# Patient Record
Sex: Female | Born: 1990 | Race: Black or African American | Hispanic: No | Marital: Single | Smoking: Never smoker
Health system: Southern US, Community
[De-identification: ages and names within clinical notes are randomized; demographics above are authoritative.]

## PROBLEM LIST (undated history)

## (undated) DIAGNOSIS — N390 Urinary tract infection, site not specified: Secondary | ICD-10-CM

## (undated) DIAGNOSIS — N926 Irregular menstruation, unspecified: Secondary | ICD-10-CM

---

## 2012-01-25 ENCOUNTER — Emergency Department (INDEPENDENT_AMBULATORY_CARE_PROVIDER_SITE_OTHER)
Admission: EM | Admit: 2012-01-25 | Discharge: 2012-01-25 | Disposition: A | Discharge status: Home / Self Care | Payer: Self-pay | Source: Home / Self Care | Attending: Emergency Medicine | Admitting: Emergency Medicine

## 2012-01-25 ENCOUNTER — Encounter (HOSPITAL_COMMUNITY): Payer: Self-pay | Admitting: *Deleted

## 2012-01-25 DIAGNOSIS — S93401A Sprain of unspecified ligament of right ankle, initial encounter: Secondary | ICD-10-CM

## 2012-01-25 DIAGNOSIS — S93409A Sprain of unspecified ligament of unspecified ankle, initial encounter: Secondary | ICD-10-CM

## 2012-01-25 HISTORY — DX: Irregular menstruation, unspecified: N92.6

## 2012-01-25 MED ORDER — HYDROCODONE-ACETAMINOPHEN 5-325 MG PO TABS: 2.0000 | ORAL_TABLET | ORAL | Status: AC | PRN

## 2012-01-25 MED ORDER — MELOXICAM 15 MG PO TABS: 15.0000 mg | ORAL_TABLET | Freq: Every day | ORAL | Status: DC

## 2012-01-25 NOTE — Discharge Instructions (Signed)
You will need to go to Occupational Health for a work note and work restrictions. Continue icing your ankle for 20 minutes at a time. Take the medication as written. Take 1 gram of tylenol with up to 4 times a day as needed for pain and fever. This with the mobic is an effective combination for pain. Take the hydrocodone/norco only for severe pain. Do not take the tylenol and hydrocodone/norco as they both have tylenol in them and too much can hurt your liver. Return if you get worse, have a  fever >100.4, or for any concerns.   Go to www.goodrx.com to look up your medications. This will give you a list of where you can find your prescriptions at the most affordable prices.
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
## 2012-01-29 ENCOUNTER — Telehealth (HOSPITAL_COMMUNITY): Payer: Self-pay | Admitting: *Deleted

## 2012-01-29 NOTE — ED Notes (Signed)
Pt called for instructions on ankle splint and crutches.  Pt states she hurt her ankle at work and was supposed to follow up with [facility] per Dr. Chaney Malling, but her employer won't allow her.  Pt states she can not perform her job with crutches.  Pt advised to come back in for reevaluation and possibly additional work note.

## 2012-02-05 ENCOUNTER — Telehealth: Payer: Self-pay | Admitting: Internal Medicine

## 2012-02-05 NOTE — Telephone Encounter (Signed)
Tried calling the patient back with number she provided.  A young lady answered and replyed that no one by that name was at the given number.  I explained I was looking for a patient to schedule who called earlier, and the called hung up the line.  This was the number she provided during the original call.   Arlys John approved to waive the 125.00 no insurance fee due to the appt being about worker's comp)

## 2012-02-05 NOTE — Telephone Encounter (Signed)
Ok with me 

## 2012-02-05 NOTE — Telephone Encounter (Signed)
The pt called and is hoping to be seen as soon as possible.  She would be a new pt.  She does not have insurance, but is under the impression her employer will pay for ov due to it being worker's comp related.  Can I add her into the physical slot for Monday?

## 2012-06-19 ENCOUNTER — Emergency Department (INDEPENDENT_AMBULATORY_CARE_PROVIDER_SITE_OTHER)
Admission: EM | Admit: 2012-06-19 | Discharge: 2012-06-19 | Disposition: A | Discharge status: Nursing Home (Includes ICF) | Payer: Self-pay | Source: Home / Self Care | Attending: Family Medicine | Admitting: Family Medicine

## 2012-06-19 ENCOUNTER — Encounter (HOSPITAL_COMMUNITY): Payer: Self-pay

## 2012-06-19 ENCOUNTER — Encounter (HOSPITAL_COMMUNITY): Payer: Self-pay | Admitting: *Deleted

## 2012-06-19 ENCOUNTER — Observation Stay (HOSPITAL_COMMUNITY)
Admission: EM | Admit: 2012-06-19 | Discharge: 2012-06-19 | Disposition: A | Discharge status: Home / Self Care | Payer: Self-pay | Attending: Emergency Medicine | Admitting: Emergency Medicine

## 2012-06-19 DIAGNOSIS — J45909 Unspecified asthma, uncomplicated: Secondary | ICD-10-CM | POA: Insufficient documentation

## 2012-06-19 DIAGNOSIS — N10 Acute tubulo-interstitial nephritis: Secondary | ICD-10-CM

## 2012-06-19 DIAGNOSIS — N390 Urinary tract infection, site not specified: Principal | ICD-10-CM | POA: Insufficient documentation

## 2012-06-19 DIAGNOSIS — N12 Tubulo-interstitial nephritis, not specified as acute or chronic: Secondary | ICD-10-CM

## 2012-06-19 DIAGNOSIS — R112 Nausea with vomiting, unspecified: Secondary | ICD-10-CM | POA: Insufficient documentation

## 2012-06-19 DIAGNOSIS — R509 Fever, unspecified: Secondary | ICD-10-CM | POA: Insufficient documentation

## 2012-06-19 HISTORY — DX: Urinary tract infection, site not specified: N39.0

## 2012-06-19 LAB — CBC WITH DIFFERENTIAL/PLATELET
Basophils Relative: 1 % (ref 0–1)
Eosinophils Absolute: 0 10*3/uL (ref 0.0–0.7)
Eosinophils Relative: 0 % (ref 0–5)
HCT: 33 % — ABNORMAL LOW (ref 36.0–46.0)
Hemoglobin: 11.5 g/dL — ABNORMAL LOW (ref 12.0–15.0)
Lymphs Abs: 1.8 10*3/uL (ref 0.7–4.0)
MCH: 25.4 pg — ABNORMAL LOW (ref 26.0–34.0)
MCHC: 34.8 g/dL (ref 30.0–36.0)
MCV: 72.8 fL — ABNORMAL LOW (ref 78.0–100.0)
Monocytes Absolute: 1.8 10*3/uL — ABNORMAL HIGH (ref 0.1–1.0)
Neutro Abs: 5.3 10*3/uL (ref 1.7–7.7)
Neutrophils Relative %: 59 % (ref 43–77)
RBC: 4.53 MIL/uL (ref 3.87–5.11)

## 2012-06-19 LAB — URINALYSIS, ROUTINE W REFLEX MICROSCOPIC
Glucose, UA: NEGATIVE mg/dL
Ketones, ur: NEGATIVE mg/dL
Protein, ur: 100 mg/dL — AB
pH: 6 (ref 5.0–8.0)

## 2012-06-19 LAB — POCT URINALYSIS DIP (DEVICE)
Protein, ur: >=300 mg/dL — AB
Specific Gravity, Urine: 1.01 (ref 1.005–1.030)
Urobilinogen, UA: 0.2 mg/dL (ref 0.0–1.0)
pH: 6 (ref 5.0–8.0)

## 2012-06-19 LAB — BASIC METABOLIC PANEL
BUN: 10 mg/dL (ref 6–23)
CO2: 27 mEq/L (ref 19–32)
Calcium: 9 mg/dL (ref 8.4–10.5)
Chloride: 98 mEq/L (ref 96–112)
Creatinine, Ser: 1.09 mg/dL (ref 0.50–1.10)
Glucose, Bld: 103 mg/dL — ABNORMAL HIGH (ref 70–99)

## 2012-06-19 LAB — POCT PREGNANCY, URINE: Preg Test, Ur: NEGATIVE

## 2012-06-19 LAB — URINE MICROSCOPIC-ADD ON

## 2012-06-19 MED ORDER — ONDANSETRON HCL 4 MG/2ML IJ SOLN: 4.0000 mg | Freq: Four times a day (QID) | INTRAMUSCULAR | Status: DC | PRN

## 2012-06-19 MED ORDER — ACETAMINOPHEN 325 MG PO TABS
975.0000 mg | ORAL_TABLET | Freq: Once | ORAL | Status: AC
  Administered 2012-06-19: 975 mg via ORAL

## 2012-06-19 MED ORDER — SODIUM CHLORIDE 0.9 % IV SOLN: 1000.0000 mL | INTRAVENOUS | Status: DC

## 2012-06-19 MED ORDER — CIPROFLOXACIN HCL 500 MG PO TABS: 500.0000 mg | ORAL_TABLET | Freq: Two times a day (BID) | ORAL | Status: AC

## 2012-06-19 MED ORDER — SODIUM CHLORIDE 0.9 % IV SOLN
Freq: Once | INTRAVENOUS | Status: AC
  Administered 2012-06-19: 14:00:00 via INTRAVENOUS

## 2012-06-19 MED ORDER — ONDANSETRON HCL 4 MG/2ML IJ SOLN
4.0000 mg | Freq: Once | INTRAMUSCULAR | Status: AC
  Administered 2012-06-19: 4 mg via INTRAVENOUS

## 2012-06-19 MED ORDER — ACETAMINOPHEN 325 MG PO TABS: 650.0000 mg | ORAL_TABLET | ORAL | Status: DC | PRN

## 2012-06-19 MED ORDER — ACETAMINOPHEN 325 MG PO TABS
ORAL_TABLET | ORAL | Status: AC
  Filled 2012-06-19: qty 3

## 2012-06-19 MED ORDER — ACETAMINOPHEN 325 MG PO TABS
ORAL_TABLET | ORAL | Status: AC
  Filled 2012-06-19: qty 1

## 2012-06-19 MED ORDER — SODIUM CHLORIDE 0.9 % IV BOLUS (SEPSIS): 1000.0000 mL | Freq: Once | INTRAVENOUS | Status: DC

## 2012-06-19 MED ORDER — DEXTROSE 5 % IV SOLN: 1.0000 g | INTRAVENOUS | Status: DC

## 2012-06-19 MED ORDER — HYDROMORPHONE HCL PF 1 MG/ML IJ SOLN: 1.0000 mg | Freq: Four times a day (QID) | INTRAMUSCULAR | Status: DC | PRN

## 2012-06-19 MED ORDER — OXYCODONE-ACETAMINOPHEN 5-325 MG PO TABS: 2.0000 | ORAL_TABLET | ORAL | Status: AC | PRN

## 2012-06-19 MED ORDER — ONDANSETRON HCL 4 MG/2ML IJ SOLN
INTRAMUSCULAR | Status: AC
  Filled 2012-06-19: qty 2

## 2012-06-19 MED ORDER — SODIUM CHLORIDE 0.9 % IV SOLN
INTRAVENOUS | Status: DC
  Administered 2012-06-19: 18:00:00 via INTRAVENOUS

## 2012-06-19 MED ORDER — DEXTROSE 5 % IV SOLN
1.0000 g | Freq: Two times a day (BID) | INTRAVENOUS | Status: DC
  Administered 2012-06-19: 1 g via INTRAVENOUS
  Filled 2012-06-19: qty 10

## 2012-06-19 NOTE — ED Provider Notes (Signed)
History     CSN: [id number]  Arrival date & time 06/19/12  1446   First MD Initiated Contact with Patient 06/19/12 1514      Chief Complaint  Patient presents with  . Urinary Tract Infection    (Consider location/radiation/quality/duration/timing/severity/associated sxs/prior treatment) Patient is a 21 y.o. female presenting with urinary tract infection. The history is provided by the patient.  Urinary Tract Infection   patient here with right-sided flank pain x4 days. Return home of 103. Vomiting without diarrhea. Does note dysuria and frequency. Denies any vaginal bleeding or discharge. Went to urgent care Center diagnosed with pyelonephritis there and sent here for evaluation the  Past Medical History  Diagnosis Date  . Asthma   . Irregular menses   . UTI (lower urinary tract infection)     History reviewed. No pertinent past surgical history.  Family History  Problem Relation Age of Onset  . Diabetes Other   . Hypertension Other     History  Substance Use Topics  . Smoking status: Never Smoker   . Smokeless tobacco: Not on file  . Alcohol Use: No    OB History    Grav Para Term Preterm Abortions TAB SAB Ect Mult Living                  Review of Systems  All other systems reviewed and are negative.    Allergies  Milk-related compounds  Home Medications  No current outpatient prescriptions on file.  BP 102/65  Pulse 68  Temp 99 F (37.2 C) (Oral)  Resp 18  SpO2 97%  Physical Exam  Nursing note and vitals reviewed. Constitutional: She is oriented to person, place, and time. She appears well-developed and well-nourished.  Non-toxic appearance. No distress.  HENT:  Head: Normocephalic and atraumatic.  Eyes: Conjunctivae normal, EOM and lids are normal. Pupils are equal, round, and reactive to light.  Neck: Normal range of motion. Neck supple. No tracheal deviation present. No mass present.  Cardiovascular: Normal rate, regular rhythm and normal  heart sounds.  Exam reveals no gallop.   No murmur heard. Pulmonary/Chest: Effort normal and breath sounds normal. No stridor. No respiratory distress. She has no decreased breath sounds. She has no wheezes. She has no rhonchi. She has no rales.  Abdominal: Soft. Normal appearance and bowel sounds are normal. She exhibits no distension. There is no tenderness. There is no rebound and no CVA tenderness.  Musculoskeletal: Normal range of motion. She exhibits no edema and no tenderness.  Neurological: She is alert and oriented to person, place, and time. She has normal strength. No cranial nerve deficit or sensory deficit. GCS eye subscore is 4. GCS verbal subscore is 5. GCS motor subscore is 6.  Skin: Skin is warm and dry. No abrasion and no rash noted.  Psychiatric: She has a normal mood and affect. Her speech is normal and behavior is normal.    ED Course  Procedures (including critical care time)   Labs Reviewed  CBC WITH DIFFERENTIAL  BASIC METABOLIC PANEL  URINALYSIS, ROUTINE W REFLEX MICROSCOPIC  URINE CULTURE   No results found.   No diagnosis found.    MDM  Patient sent to the CDU for treatment of her pyelonephritis        Toy Baker, MD 06/23/12 2332

## 2012-06-19 NOTE — ED Notes (Signed)
[facility] notified of transfer.  Attempt to call report to ED Charge RN - RN unavailable.

## 2012-06-19 NOTE — ED Notes (Signed)
Received bedside report from Clydie Braun, Charity fundraiser.  Patient currently resting quietly in bed; no respiratory or acute distress noted.  Patient updated on plan of care; informed patient that we are currently waiting on further orders/disposition from EDP.  Patient has no other questions or concerns at this time; will continue to monitor.

## 2012-06-19 NOTE — ED Notes (Signed)
Patient currently resting quietly; no respiratory or acute distress noted.  Patient currently waiting on discharge paperwork; getting dressed at this time.

## 2012-06-19 NOTE — ED Notes (Signed)
At 3 minute mark, urine HCG read negative; few minutes later faint positive line showed.  Blood drawn for serum HCG.  Patient states she had same thing happen back in January, and when she went for repeat labs, serum HCG levels had dropped back down.

## 2012-06-19 NOTE — ED Notes (Signed)
Patient given copy of discharge paperwork; went over discharge instructions with patient.  Patient instructed to take antibiotics and pain medication as directed, to finish entire antibiotic prescription, to not drive/drink while taking narcotics, to follow up with primary care physician, and to return to the ED for new, worsening, or concerning symptoms.

## 2012-06-19 NOTE — ED Notes (Addendum)
Reports started with UTI sxs Monday night (dysuria, urgency, frequent urination).  On Wed started with vomiting, diarrhea.  Has been having suprapubic abd pain and HA.  Has not been taking any meds.  Was unaware of fevers.  Unsure of LMP.

## 2012-06-19 NOTE — ED Notes (Signed)
Pt transferred here from [facility] for possible pyelonephritis. Pt c/o bladder pain for past 4 days. Also states she does remember having some pain to her right lower back earlier in the week. 20g to LAC w/ NS 500 ml to count. 104 temp from [facility] and was given 1gm Tylenol.

## 2012-06-19 NOTE — ED Provider Notes (Signed)
Patient transferred to the CDU for pyelonephritis protocol. After receiving IV Rocephin, patient reports significant improvement. She is no longer febrile. She is in no acute distress. Heart has RRR and lungs CTA bilaterally. Patient ambulatory without difficulty. She can be discharged with a 14 day course of Cipro and Percocet for pain.   Emilia Beck, PA-C 06/19/12 2214

## 2012-06-19 NOTE — ED Notes (Signed)
Report given to [place], ED Charge RN.

## 2012-06-19 NOTE — ED Provider Notes (Signed)
History     CSN: [id number]  Arrival date & time 06/19/12  1241   First MD Initiated Contact with Patient 06/19/12 1241      Chief Complaint  Patient presents with  . Urinary Tract Infection  . Fever  . Emesis    (Consider location/radiation/quality/duration/timing/severity/associated sxs/prior treatment) Patient is a 21 y.o. female presenting with female genitourinary complaint. The history is provided by the patient.  Female GU Problem Primary symptoms include pelvic pain and dysuria.  Primary symptoms include no vaginal bleeding. The maximum temperature recorded prior to her arrival was more than 104 F. The current episode started more than 2 days ago. The problem has been gradually worsening. The patient's menstrual history has been irregular. Associated symptoms include anorexia, diarrhea, nausea, vomiting, frequency and light-headedness.    Past Medical History  Diagnosis Date  . Asthma   . Irregular menses   . UTI (lower urinary tract infection)     History reviewed. No pertinent past surgical history.  Family History  Problem Relation Age of Onset  . Diabetes Other   . Hypertension Other     History  Substance Use Topics  . Smoking status: Never Smoker   . Smokeless tobacco: Not on file  . Alcohol Use: No    OB History    Grav Para Term Preterm Abortions TAB SAB Ect Mult Living                  Review of Systems  Constitutional: Positive for fever, chills, appetite change and fatigue.  HENT: Negative.   Gastrointestinal: Positive for nausea, vomiting, diarrhea and anorexia.  Genitourinary: Positive for dysuria, urgency, frequency and pelvic pain. Negative for vaginal bleeding, vaginal discharge and vaginal pain.  Neurological: Positive for light-headedness.    Allergies  Milk-related compounds  Home Medications   Current Outpatient Rx  Name Route Sig Dispense Refill  . ALBUTEROL IN Inhalation Inhale into the lungs as needed.    . MELOXICAM 15  MG PO TABS Oral Take 1 tablet (15 mg total) by mouth daily. 14 tablet 0    BP 98/61  Pulse 135  Temp 104 F (40 C) (Oral)  Resp 18  SpO2 98%  Physical Exam  Nursing note and vitals reviewed. Constitutional: She is oriented to person, place, and time. She appears well-developed and well-nourished.  HENT:  Mouth/Throat: Oropharynx is clear and moist.  Eyes: Conjunctivae normal are normal. Pupils are equal, round, and reactive to light.  Neck: Normal range of motion. Neck supple.  Cardiovascular: Normal rate and normal heart sounds.   Pulmonary/Chest: Breath sounds normal.  Abdominal: Soft. Bowel sounds are normal. She exhibits no distension and no mass. There is no tenderness. There is no rebound and no guarding.  Lymphadenopathy:    She has no cervical adenopathy.  Neurological: She is alert and oriented to person, place, and time.  Skin: Skin is warm and dry.    ED Course  Procedures (including critical care time)  Labs Reviewed  POCT URINALYSIS DIP (DEVICE) - Abnormal; Notable for the following:    Hgb urine dipstick LARGE (*)     Protein, ur >=300 (*)     Leukocytes, UA LARGE (*)  Biochemical Testing Only. Please order routine urinalysis from main lab if confirmatory testing is needed.   All other components within normal limits  POCT PREGNANCY, URINE   No results found.   1. Pyelonephritis, acute       MDM  U/a  abnl.  Linna Hoff, MD 06/19/12 1536

## 2012-06-20 NOTE — ED Provider Notes (Signed)
Medical screening examination/treatment/procedure(s) were conducted as a shared visit with non-physician practitioner(s) and myself.  I personally evaluated the patient during the encounter  Toy Baker, MD 06/20/12 0930

## 2012-06-21 LAB — URINE CULTURE: Colony Count: >=100000

## 2012-06-23 NOTE — ED Notes (Signed)
+   Urine Patient treated with Cipro-sensitive to same-chart appended per protocol MD. 

## 2012-10-27 ENCOUNTER — Emergency Department (HOSPITAL_COMMUNITY)
Admission: EM | Admit: 2012-10-27 | Discharge: 2012-10-27 | Disposition: A | Discharge status: Home / Self Care | Payer: No Typology Code available for payment source | Attending: Emergency Medicine | Admitting: Emergency Medicine

## 2012-10-27 ENCOUNTER — Emergency Department (HOSPITAL_COMMUNITY): Payer: No Typology Code available for payment source

## 2012-10-27 ENCOUNTER — Encounter (HOSPITAL_COMMUNITY): Payer: Self-pay | Admitting: Emergency Medicine

## 2012-10-27 DIAGNOSIS — S335XXA Sprain of ligaments of lumbar spine, initial encounter: Secondary | ICD-10-CM | POA: Insufficient documentation

## 2012-10-27 DIAGNOSIS — S239XXA Sprain of unspecified parts of thorax, initial encounter: Secondary | ICD-10-CM | POA: Insufficient documentation

## 2012-10-27 DIAGNOSIS — Z87440 Personal history of urinary (tract) infections: Secondary | ICD-10-CM | POA: Insufficient documentation

## 2012-10-27 DIAGNOSIS — J45909 Unspecified asthma, uncomplicated: Secondary | ICD-10-CM | POA: Insufficient documentation

## 2012-10-27 DIAGNOSIS — T148XXA Other injury of unspecified body region, initial encounter: Secondary | ICD-10-CM

## 2012-10-27 DIAGNOSIS — Z8742 Personal history of other diseases of the female genital tract: Secondary | ICD-10-CM | POA: Insufficient documentation

## 2012-10-27 DIAGNOSIS — S199XXA Unspecified injury of neck, initial encounter: Secondary | ICD-10-CM | POA: Insufficient documentation

## 2012-10-27 DIAGNOSIS — Y9389 Activity, other specified: Secondary | ICD-10-CM | POA: Insufficient documentation

## 2012-10-27 DIAGNOSIS — S0993XA Unspecified injury of face, initial encounter: Secondary | ICD-10-CM | POA: Insufficient documentation

## 2012-10-27 DIAGNOSIS — S139XXA Sprain of joints and ligaments of unspecified parts of neck, initial encounter: Secondary | ICD-10-CM | POA: Insufficient documentation

## 2012-10-27 DIAGNOSIS — Y92410 Unspecified street and highway as the place of occurrence of the external cause: Secondary | ICD-10-CM | POA: Insufficient documentation

## 2012-10-27 MED ORDER — HYDROCODONE-ACETAMINOPHEN 5-325 MG PO TABS: ORAL_TABLET | ORAL | Status: DC

## 2012-10-27 MED ORDER — CYCLOBENZAPRINE HCL 10 MG PO TABS
5.0000 mg | ORAL_TABLET | Freq: Once | ORAL | Status: AC
  Administered 2012-10-27: 5 mg via ORAL
  Filled 2012-10-27: qty 1

## 2012-10-27 MED ORDER — CYCLOBENZAPRINE HCL 10 MG PO TABS
10.0000 mg | ORAL_TABLET | Freq: Two times a day (BID) | ORAL | Status: DC | PRN
Start: 2012-10-27 — End: 2014-12-03

## 2012-10-27 MED ORDER — NAPROXEN 500 MG PO TABS: 500.0000 mg | ORAL_TABLET | Freq: Two times a day (BID) | ORAL | Status: DC

## 2012-10-27 MED ORDER — HYDROCODONE-ACETAMINOPHEN 5-325 MG PO TABS
1.0000 | ORAL_TABLET | Freq: Once | ORAL | Status: AC
  Administered 2012-10-27: 1 via ORAL
  Filled 2012-10-27: qty 1

## 2012-10-27 NOTE — ED Notes (Signed)
Care transferred and report given to Juli, RN 
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
## 2012-10-27 NOTE — ED Provider Notes (Signed)
Medical screening examination/treatment/procedure(s) were performed by non-physician practitioner and as supervising physician I was immediately available for consultation/collaboration.   Carleene Cooper III, MD 10/27/12 2009

## 2012-10-27 NOTE — ED Notes (Signed)
Pt ambulated with quick, steady gait.
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
## 2014-06-06 LAB — OB RESULTS CONSOLE ABO/RH

## 2014-06-06 LAB — OB RESULTS CONSOLE HEPATITIS B SURFACE ANTIGEN: Hepatitis B Surface Ag: NEGATIVE

## 2014-09-18 ENCOUNTER — Ambulatory Visit
Payer: No Typology Code available for payment source | Attending: Obstetrics and Gynecology | Admitting: Physical Therapy

## 2014-09-18 DIAGNOSIS — M5432 Sciatica, left side: Secondary | ICD-10-CM | POA: Insufficient documentation

## 2014-09-18 DIAGNOSIS — M5431 Sciatica, right side: Secondary | ICD-10-CM | POA: Diagnosis not present

## 2014-09-18 NOTE — Patient Instructions (Signed)
Therapeutic - Knee to Chest   Bend knee up towards chest, use towel if needed.  Push leg out against hands keeping leg in place.  Hold 5 seconds.  Perform 10 reps.  You can also do this sitting. Copyright  VHI. All rights reserved.   Backward Bend (Standing)   Arch backward to make hollow of back deeper. Hold _2-3___ seconds. Repeat _10___ times per set. Do __1__ sets per session. Do __2-3__ sessions per day.  Kristine CraneStephanie F Lyrika Williams, PT, DPT 09/18/2014 11:38 AM  [facility] [address] [place], [place] [address]  [phone] (office) [phone] (fax)
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
## 2014-10-02 ENCOUNTER — Ambulatory Visit: Payer: No Typology Code available for payment source | Admitting: Physical Therapy

## 2014-10-02 DIAGNOSIS — M5432 Sciatica, left side: Secondary | ICD-10-CM

## 2014-10-02 DIAGNOSIS — M5431 Sciatica, right side: Secondary | ICD-10-CM

## 2014-10-02 NOTE — Patient Instructions (Addendum)
Back Pain in Pregnancy Back pain during pregnancy is common. It happens in about half of all pregnancies. It is important for you and your baby that you remain active during your pregnancy.If you feel that back pain is not allowing you to remain active or sleep well, it is time to see your caregiver. Back pain may be caused by several factors related to changes during your pregnancy.Fortunately, unless you had trouble with your back before your pregnancy, the pain is likely to get better after you deliver. Low back pain usually occurs between the fifth and seventh months of pregnancy. It can, however, happen in the first couple months. Factors that increase the risk of back problems include:   Previous back problems.  Injury to your back.  Having twins or multiple births.  A chronic cough.  Stress.  Job-related repetitive motions.  Muscle or spinal disease in the back.  Family history of back problems, ruptured (herniated) discs, or osteoporosis.  Depression, anxiety, and panic attacks. CAUSES   When you are pregnant, your body produces a hormone called relaxin. This hormonemakes the ligaments connecting the low back and pubic bones more flexible. This flexibility allows the baby to be delivered more easily. When your ligaments are loose, your muscles need to work harder to support your back. Soreness in your back can come from tired muscles. Soreness can also come from back tissues that are irritated since they are receiving less support.  As the baby grows, it puts pressure on the nerves and blood vessels in your pelvis. This can cause back pain.  As the baby grows and gets heavier during pregnancy, the uterus pushes the stomach muscles forward and changes your center of gravity. This makes your back muscles work harder to maintain good posture. SYMPTOMS  Lumbar pain during pregnancy Lumbar pain during pregnancy usually occurs at or above the waist in the center of the back. There  may be pain and numbness that radiates into your leg or foot. This is similar to low back pain experienced by non-pregnant women. It usually increases with sitting for long periods of time, standing, or repetitive lifting. Tenderness may also be present in the muscles along your upper back. Posterior pelvic pain during pregnancy Pain in the back of the pelvis is more common than lumbar pain in pregnancy. It is a deep pain felt in your side at the waistline, or across the tailbone (sacrum), or in both places. You may have pain on one or both sides. This pain can also go into the buttocks and backs of the upper thighs. Pubic and groin pain may also be present. The pain does not quickly resolve with rest, and morning stiffness may also be present. Pelvic pain during pregnancy can be brought on by most activities. A high level of fitness before and during pregnancy may or may not prevent this problem. Labor pain is usually 1 to 2 minutes apart, lasts for about 1 minute, and involves a bearing down feeling or pressure in your pelvis. However, if you are at term with the pregnancy, constant low back pain can be the beginning of early labor, and you should be aware of this. DIAGNOSIS  X-rays of the back should not be done during the first 12 to 14 weeks of the pregnancy and only when absolutely necessary during the rest of the pregnancy. MRIs do not give off radiation and are safe during pregnancy. MRIs also should only be done when absolutely necessary. HOME CARE INSTRUCTIONS  Exercise   as directed by your caregiver. Exercise is the most effective way to prevent or manage back pain. If you have a back problem, it is especially important to avoid sports that require sudden body movements. Swimming and walking are great activities.  Do not stand in one place for long periods of time.  Do not wear high heels.  Sit in chairs with good posture. Use a pillow on your lower back if necessary. Make sure your head  rests over your shoulders and is not hanging forward.  Try sleeping on your side, preferably the left side, with a pillow or two between your legs. If you are sore after a night's rest, your bedmay betoo soft.Try placing a board between your mattress and box spring.  Listen to your body when lifting.If you are experiencing pain, ask for help or try bending yourknees more so you can use your leg muscles rather than your back muscles. Squat down when picking up something from the floor. Do not bend over.  Eat a healthy diet. Try to gain weight within your caregiver's recommendations.  Use heat or cold packs 3 to 4 times a day for 15 minutes to help with the pain.  Only take over-the-counter or prescription medicines for pain, discomfort, or fever as directed by your caregiver. Sudden (acute) back pain  Use bed rest for only the most extreme, acute episodes of back pain. Prolonged bed rest over 48 hours will aggravate your condition.  Ice is very effective for acute conditions.  Put ice in a plastic bag.  Place a towel between your skin and the bag.  Leave the ice on for 10 to 20 minutes every 2 hours, or as needed.  Using heat packs for 30 minutes prior to activities is also helpful. Continued back pain See your caregiver if you have continued problems. Your caregiver can help or refer you for appropriate physical therapy. With conditioning, most back problems can be avoided. Sometimes, a more serious issue may be the cause of back pain. You should be seen right away if new problems seem to be developing. Your caregiver may recommend:  A maternity girdle.  An elastic sling.  A back brace.  A massage therapist or acupuncture. SEEK MEDICAL CARE IF:   You are not able to do most of your daily activities, even when taking the pain medicine you were given.  You need a referral to a physical therapist or chiropractor.  You want to try acupuncture. SEEK IMMEDIATE MEDICAL CARE  IF:  You develop numbness, tingling, weakness, or problems with the use of your arms or legs.  You develop severe back pain that is no longer relieved with medicines.  You have a sudden change in bowel or bladder control.  You have increasing pain in other areas of the body.  You develop shortness of breath, dizziness, or fainting.  You develop nausea, vomiting, or sweating.  You have back pain which is similar to labor pains.  You have back pain along with your water breaking or vaginal bleeding.  You have back pain or numbness that travels down your leg.  Your back pain developed after you fell.  You develop pain on one side of your back. You may have a kidney stone.  You see blood in your urine. You may have a bladder infection or kidney stone.  You have back pain with blisters. You may have shingles. Back pain is fairly common during pregnancy but should not be accepted as just part of   the process. Back pain should always be treated as soon as possible. This will make your pregnancy as pleasant as possible. Document Released: 12/30/2005 Document Revised: 12/14/2011 Document Reviewed: 02/10/2011 [facility] Patient Information 2015 [employer] [place]. This information is not intended to replace advice given to you by your health care provider. Make sure you discuss any questions you have with your health care provider.  Pt given handout for Sacral Iliac Joint Exercises ' With Hip ADD with ball x 10,  Hip abduction with theraband with Green theraband Unilateral hip extension in supine x 10 Trunk rotation to left and right with  30 second hold  Pt given written information on Serola SI gait belt  Sleeping on Back  Place pillow under knees. A pillow with cervical support and a roll around waist are also helpful. Copyright  VHI. All rights reserved.  Sleeping on Side Place pillow between knees. Use cervical support under neck and a roll around waist as needed. Copyright  VHI.  All rights reserved.   Sleeping on Stomach   If this is the only desirable sleeping position, place pillow under lower legs, and under stomach or chest as needed.  Posture - Sitting   Sit upright, head facing forward. Try using a roll to support lower back. Keep shoulders relaxed, and avoid rounded back. Keep hips level with knees. Avoid crossing legs for long periods. Stand to Sit / Sit to Stand   To sit: Bend knees to lower self onto front edge of chair, then scoot back on seat. To stand: Reverse sequence by placing one foot forward, and scoot to front of seat. Use rocking motion to stand up.   Work Height and Reach  Ideal work height is no more than 2 to 4 inches below elbow level when standing, and at elbow level when sitting. Reaching should be limited to arm's length, with elbows slightly bent.  Bending  Bend at hips and knees, not back. Keep feet shoulder-width apart.    Posture - Standing   Good posture is important. Avoid slouching and forward head thrust. Maintain curve in low back and align ears over shoul- ders, hips over ankles.  Alternating Positions   Alternate tasks and change positions frequently to reduce fatigue and muscle tension. Take rest breaks. Computer Work   Position work to Art gallery managerface forward. Use proper work and seat height. Keep shoulders back and down, wrists straight, and elbows at right angles. Use chair that provides full back support. Add footrest and lumbar roll as needed.  Getting Into / Out of Car  Lower self onto seat, scoot back, then bring in one leg at a time. Reverse sequence to get out.  Dressing  Lie on back to pull socks or slacks over feet, or sit and bend leg while keeping back straight.    Housework - Sink  Place one foot on ledge of cabinet under sink when standing at sink for prolonged periods.   Pushing / Pulling  Pushing is preferable to pulling. Keep back in proper alignment, and use leg muscles to do the  work.  Deep Squat   Squat and lift with both arms held against upper trunk. Tighten stomach muscles without holding breath. Use smooth movements to avoid jerking.  Avoid Twisting   Avoid twisting or bending back. Pivot around using foot movements, and bend at knees if needed when reaching for articles.  Carrying Luggage   Distribute weight evenly on both sides. Use a cart whenever possible. Do not twist trunk.  Move body as a unit.   Lifting Principles .Maintain proper posture and head alignment. .Slide object as close as possible before lifting. .Move obstacles out of the way. .Test before lifting; ask for help if too heavy. .Tighten stomach muscles without holding breath. .Use smooth movements; do not jerk. .Use legs to do the work, and pivot with feet. .Distribute the work load symmetrically and close to the center of trunk. .Push instead of pull whenever possible.   Ask For Help   Ask for help and delegate to others when possible. Coordinate your movements when lifting together, and maintain the low back curve.  Log Roll   Lying on back, bend left knee and place left arm across chest. Roll all in one movement to the right. Reverse to roll to the left. Always move as one unit. Housework - Sweeping  Use long-handled equipment to avoid stooping.   Housework - Wiping  Position yourself as close as possible to reach work surface. Avoid straining your back.  Laundry - Unloading Wash   To unload small items at bottom of washer, lift leg opposite to arm being used to reach.  Gardening - Raking  Move close to area to be raked. Use arm movements to do the work. Keep back straight and avoid twisting.     Cart  When reaching into cart with one arm, lift opposite leg to keep back straight.   Getting Into / Out of Bed  Lower self to lie down on one side by raising legs and lowering head at the same time. Use arms to assist moving without twisting. Bend both knees  to roll onto back if desired. To sit up, start from lying on side, and use same move-ments in reverse. Housework - Vacuuming  Hold the vacuum with arm held at side. Step back and forth to move it, keeping head up. Avoid twisting.   Laundry - Armed forces training and education officerLoading Wash  Position laundry basket so that bending and twisting can be avoided.   Laundry - Unloading Dryer  Squat down to reach into clothes dryer or use a reacher.  Gardening - Weeding / Psychiatric nurselanting  Squat or Kneel. Knee pads may be helpful.

## 2014-10-02 NOTE — Therapy (Signed)
[facility] [address] [place], [place], [address] Phone: [phone]   Fax:  [phone]  Physical Therapy Treatment  Patient Details  Name: Kristine Williams MRN: [id number] Date of Birth: 1991/03/20  Encounter Date: 10/02/2014      PT End of Session - 10/02/14 1833    Visit Number 2   Number of Visits 16   Date for PT Re-Evaluation 11/17/14   PT Start Time 1105   PT Stop Time 1145   PT Time Calculation (min) 40 min   Equipment Utilized During Treatment Other (comment)  used SI belt for trial      Past Medical History  Diagnosis Date  . Asthma   . Irregular menses   . UTI (lower urinary tract infection)     No past surgical history on file.  There were no vitals taken for this visit.  Visit Diagnosis:  Sciatica, left  Sciatica, right  Worse for this visit      Subjective Assessment - 10/02/14 1108    Symptoms Pt reports pain 1-2 on R side aching nagging pain.  Sometimes it is 8/10 sharp pain radiating to knees on  both sides.     Pertinent History Pt is 7 months pregnant   How long can you sit comfortably? unlimited   How long can you stand comfortably? 10 minutes   How long can you walk comfortably? presently able to walk to shop for groceries but cannot lift   Diagnostic tests MRI bulging disc L4/5   Currently in Pain? Yes   Pain Score 2    Pain Location Back   Pain Orientation Right   Pain Descriptors / Indicators Aching;Stabbing  over R PSIS   Pain Type Neuropathic pain   Pain Radiating Towards none today   Pain Onset More than a month ago   Pain Frequency Intermittent   Aggravating Factors  unable to lie down without sensitive pain                    [facility] Adult PT Treatment/Exercise - 10/02/14 1141    Ambulation/Gait   Ambulation/Gait Yes   Ambulation/Gait Assistance 7: Independent   Gait Pattern Decreased stance time - right;Lateral trunk lean to left;Decreased trunk rotation   Posture/Postural Control   Posture/Postural Control Postural limitations   Postural Limitations Increased lumbar lordosis;Right pelvic obliquity   Posture Comments Pt will benefit from SI belt, trial of one decreased pain in supine and sit to stand and walking   Lumbar Exercises: Seated   Other Seated Lumbar Exercises self SI mob isometric with R knee flex and rotation hold 5 sec   Lumbar Exercises: Supine   Bridge 10 reps;3 seconds  x2   Other Supine Lumbar Exercises hip adduction with ball 2 sets of 10   Knee/Hip Exercises: Supine   Hip Adduction Isometric Strengthening  with PT adding resistance 2 sets of 10   Manual Therapy   Manual Therapy Other (comment);Myofascial release  muscle energy technique,    Myofascial Release of Piriformis/ R t   Other Manual Therapy kinesio tape for SI support and compression over Rt SI                PT Education - 10/02/14 1253    Education provided Yes   Education Details Pt given information about posture and body mechanics, back care in pregnancy, informatiion for [employer]          PT Short Term Goals - 10/02/14  1145    PT SHORT TERM GOAL #1   Title independent with HEP (10/16/14)   Time 4   Period Weeks   Status On-going   PT SHORT TERM GOAL #2   Title report pain < 5/10 with activity (10/16/14)   Time 4   Period Weeks   Status On-going   PT SHORT TERM GOAL #3   Title improve lumbar flexion and ext by 5 degrees for improved flexibility (10/16/14)   Time 4   Period Weeks   Status On-going           PT Long Term Goals - 10/02/14 1145    PT LONG TERM GOAL #1   Title independent with advanced HEP (11/13/14)   Time 8   Period Weeks   Status On-going   PT LONG TERM GOAL #2   Title verbalize understanding of posture/body mechanics to reduce the risk of reinjury (11/13/14)   Time 8   Period Weeks   Status On-going   PT LONG TERM GOAL #3   Title report pain < 3/10 with activity (11/13/14)   Time 8   Period Weeks   Status  On-going   PT LONG TERM GOAL #4   Title lumbar flexion and extension ROM WNL for improved mobility (11/13/13)   Time 8   Period Weeks   Status On-going               Plan - 10/02/14 1150    Clinical Impression Statement Pt is 2/10 with KT tape  different quality of pain aching than sharp.  Pt also benefitted from SI belt wear and was instructed how to purchase.  Pt instructed in SI exercises/ core strengthening exercises. PT with long term pain.  Pt will need to continue strengthening post deleivery of baby. Pt now at 7 months gestation.  Pt given HEP and posture for pregnancy as well as posture in sitting stanidng and ADL's                                                     Pt will benefit from skilled therapeutic intervention in order to improve on the following deficits Abnormal gait;Decreased mobility;Decreased strength;Pain;Postural dysfunction;Improper body mechanics;Decreased range of motion;Difficulty walking;Impaired flexibility   Rehab Potential Good   PT Frequency 2x / week   PT Duration 8 weeks   PT Treatment/Interventions ADLs/Self Care Home Management;Moist Heat;Therapeutic activities;Patient/family education;Passive range of motion;Therapeutic exercise;Traction;Gait training;Manual techniques;Neuromuscular re-education;Cryotherapy;Functional mobility training   PT Next Visit Plan review HEP, add stretches and core stability, assess KT tape benefit. Give written copy of [employer] belt to purchase.         Problem List There are no active problems to display for this patient.  Garen LahLawrie Beardsley, PT 10/02/2014 6:42 PM Phone: [phone] Fax: [phone]   [facility] [address] [place], [place], [address] Phone: [phone]   Fax:  [phone]

## 2014-10-04 ENCOUNTER — Ambulatory Visit: Payer: No Typology Code available for payment source | Admitting: Rehabilitation

## 2014-10-04 DIAGNOSIS — M5431 Sciatica, right side: Secondary | ICD-10-CM

## 2014-10-04 DIAGNOSIS — M5432 Sciatica, left side: Secondary | ICD-10-CM

## 2014-10-04 NOTE — Patient Instructions (Signed)
Angry Cat, All Fours   Kneel on hands and knees. Tuck chin and tighten stomach. Exhale and round back upward. Inhale and arch back downward. Hold each position 10___ seconds. Repeat _10__ times per session. Do 2___ sessions per day.  Copyright  VHI. All rights reserved.         Flexion   Sitting on knees, fold body over legs and relax head and arms on floor. Extend arms as far forward as possible. Hold _30___ seconds. Repeat __3__ times. Do ____ sessions per day.  Copyright  VHI. All rights reserved.  Trunk: Rotation   Lie on back on firm, flat surface with knees bent. Keep head and shoulders flat, slowly lower knees to floor. May also do with legs straight. Lift one at a time up and across to touch floor. Hold ___2_ minutes. Repeat __3__ times, each side. Do __2_ sessions per day. CAUTION: Movement should be gentle, steady and slow.  Copyright  VHI. All rights reserved.  LAY ON BACK WITH KNEES BENT, LIFT AND LOWER HIPS, STRAIGHTEN LEGS- CHECK HIP ALIGNMENT IF ONE SIDE IS HIGHER, PRESS 10 times for 5 Seconds on THAT KNEE LIFT HIPS, LOWER, STRAIGHTEN LEGS AND RECHECK REPEAT UNTIL LEVEL.   PERFORM EXERCISES AFTER HIP LEVEL

## 2014-10-04 NOTE — Therapy (Signed)
[place], [place], [address] Phone: [phone]   Fax:  [phone]  Physical Therapy Treatment  Patient Details  Name: Kristine Williams MRN: [id number] Date of Birth: 04-Feb-1991  Encounter Date: 10/04/2014      PT End of Session - 10/04/14 1217    Visit Number 3   Number of Visits 16   Date for PT Re-Evaluation 11/17/14   PT Start Time 1200   PT Stop Time 6606   PT Time Calculation (min) 45 min      Past Medical History  Diagnosis Date  . Asthma   . Irregular menses   . UTI (lower urinary tract infection)     No past surgical history on file.  There were no vitals taken for this visit.  Visit Diagnosis:  Sciatica, right  Sciatica, left      Subjective Assessment - 10/04/14 1201    Symptoms 7/10 pain rt side up to a 9/10 since last visit, could not sleep that night. Pt reports aching not radiating down leg that was sharp shooting before. Pt reports no benefit with tape and it was painful to remove                    [facility] Adult PT Treatment/Exercise - 10/04/14 0001    Lumbar Exercises: Seated   Other Seated Lumbar Exercises Left ASIS more superior so performed MET for left hip flexion isometric 5 sec x 10, ASIS level after MET   Other Seated Lumbar Exercises Pelvic tilts seated and standing to  find neutral and decrease stain in low back   Lumbar Exercises: Supine   Clam 10 reps  against belt 5 sec x 10, cues for neautral spine   Other Supine Lumbar Exercises hip adduction with ball 2 sets of 10  cues for neutral spine   Lumbar Exercises: Sidelying   Other Sidelying Lumbar Exercises trunk rotation stretch with top leg crossed over bottom leg x 2 minute bilateral, pt reports more tightness R hip verses Left                PT Education - 10/04/14 1249    Education provided Yes   Education Details HEP stretches as will as self MET for hip alignment prior to  stabilization   Person(s) Educated Patient   Methods Explanation;Handout   Comprehension Verbalized understanding          PT Short Term Goals - 10/02/14 1145    PT SHORT TERM GOAL #1   Title independent with HEP (10/16/14)   Time 4   Period Weeks   Status On-going   PT SHORT TERM GOAL #2   Title report pain < 5/10 with activity (10/16/14)   Time 4   Period Weeks   Status On-going   PT SHORT TERM GOAL #3   Title improve lumbar flexion and ext by 5 degrees for improved flexibility (10/16/14)   Time 4   Period Weeks   Status On-going           PT Long Term Goals - 10/02/14 1145    PT LONG TERM GOAL #1   Title independent with advanced HEP (11/13/14)   Time 8   Period Weeks   Status On-going   PT LONG TERM GOAL #2   Title verbalize understanding of posture/body mechanics to reduce the risk of reinjury (11/13/14)   Time 8   Period Weeks   Status On-going   PT LONG  TERM GOAL #3   Title report pain < 3/10 with activity (11/13/14)   Time 8   Period Weeks   Status On-going   PT LONG TERM GOAL #4   Title lumbar flexion and extension ROM WNL for improved mobility (11/13/13)   Time 8   Period Weeks   Status On-going               Plan - 10/04/14 1214    Clinical Impression Statement Pt reports immediate pain reief from 7/10 to 0/10 after donning SI belt. She also reports feeling much better at end of todays treatment and rates her pain at a 4/10   PT Next Visit Plan Did patient purchase SI belt? recheck alignment and continue stabilization and stretches        Problem List There are no active problems to display for this patient.   Dorene Ar, [place] 10/04/2014, 12:55 PM  [place], [place], [address] Phone: [phone]   Fax:  [phone]

## 2014-10-05 NOTE — L&D Delivery Note (Signed)
Delivery Note Pt progressed rapidly to complete dilation and pushed well.  At 2:20 PM a healthy female was delivered via Vaginal, Spontaneous Delivery (Presentation: Right Occiput Anterior).  APGAR: 8, 9; weight pending .   Placenta status: Intact, Spontaneous.  Cord: 3 vessels with the following complications: Nuchal x 1 delivered through.  Anesthesia: Local 1% lidocaine Episiotomy: None Lacerations:  Small second degree laceration Suture Repair: 3.0 vicryl rapide Est. Blood Loss (mL): 300cc  Mom to postpartum.  Baby to Couplet care / Skin to Skin. Pt plans on circumcison in office.  Oliver PilaRICHARDSON,Rosann Gorum W 12/01/2014, 2:53 PM

## 2014-10-09 ENCOUNTER — Ambulatory Visit: Payer: Medicaid Other | Attending: Obstetrics and Gynecology | Admitting: Rehabilitation

## 2014-10-09 DIAGNOSIS — M5432 Sciatica, left side: Secondary | ICD-10-CM | POA: Insufficient documentation

## 2014-10-09 DIAGNOSIS — M5431 Sciatica, right side: Secondary | ICD-10-CM

## 2014-10-09 NOTE — Patient Instructions (Signed)

## 2014-10-09 NOTE — Therapy (Signed)
[place], [place], [address] Phone: [phone]   Fax:  [phone]  Physical Therapy Treatment  Patient Details  Name: Tia Hieronymus MRN: [id number] Date of Birth: 1990/10/17  Encounter Date: 10/09/2014      PT End of Session - 10/09/14 1121    Visit Number 4   Number of Visits 16   Date for PT Re-Evaluation 11/17/14   PT Start Time 1105      Past Medical History  Diagnosis Date  . Asthma   . Irregular menses   . UTI (lower urinary tract infection)     No past surgical history on file.  There were no vitals taken for this visit.  Visit Diagnosis:  Sciatica, right  Sciatica, left      Subjective Assessment - 10/09/14 1107    Symptoms I was in so much pain last night, then all of the sudden I woke up in the middle of the night pain free and am pain free right now. 8/10 pain low back and hips last night and has averaged 7/10 daily.    Currently in Pain? No/denies           [facility] Adult PT Treatment/Exercise - 10/09/14 1129    Lumbar Exercises: Supine   Clam 20 reps  against belt 5 sec x 10, cues for neautral spine   Bridge 10 reps;3 seconds  x2   Other Supine Lumbar Exercises hip adduction with ball 2 sets of 10  cues for neutral spine   Other Supine Lumbar Exercises MET for ASIS alignment    Lumbar Exercises: Sidelying   Other Sidelying Lumbar Exercises trunk rotation stretch with top leg crossed over bottom leg x 2 minute bilateral, pt reports more tightness R hip verses Left   Other Sidelying Lumbar Exercises trial of SL QL stretch, uncomfortable, dc   Lumbar Exercises: Quadruped   Madcat/Old Horse 10 reps   Other Quadruped Lumbar Exercises childs pose forward and lateral                PT Education - 10/09/14 1123    Education Details SELF CARE: Biomedical scientist Handouts reviewed with patient   Person(s) Educated Patient   Methods Explanation;Handout   Comprehension Verbalized understanding          PT Short Term Goals - 10/02/14 1145    PT SHORT TERM GOAL #1   Title independent with HEP (10/16/14)   Time 4   Period Weeks   Status On-going   PT SHORT TERM GOAL #2   Title report pain < 5/10 with activity (10/16/14)   Time 4   Period Weeks   Status On-going   PT SHORT TERM GOAL #3   Title improve lumbar flexion and ext by 5 degrees for improved flexibility (10/16/14)   Time 4   Period Weeks   Status On-going           PT Long Term Goals - 10/02/14 1145    PT LONG TERM GOAL #1   Title independent with advanced HEP (11/13/14)   Time 8   Period Weeks   Status On-going   PT LONG TERM GOAL #2   Title verbalize understanding of posture/body mechanics to reduce the risk of reinjury (11/13/14)   Time 8   Period Weeks   Status On-going   PT LONG TERM GOAL #3   Title report pain < 3/10 with activity (11/13/14)   Time 8   Period  Weeks   Status On-going   PT LONG TERM GOAL #4   Title lumbar flexion and extension ROM WNL for improved mobility (11/13/13)   Time 8   Period Weeks   Status On-going               Plan - 10/09/14 1119    Clinical Impression Statement Pt reports she has not purchased and SI belt but does plan to do so. She has not performed recommended stabilization exercises and has mostly stretched. She does however report performing self MET regularly and demonstrated understanding of proper technique in clinic today.    PT Next Visit Plan Did patient purchase SI belt? recheck alignment and continue stabilization and stretches        Problem List There are no active problems to display for this patient.   Dorene Ar, [place] 10/09/2014, 11:42 AM  [place], [place], [address] Phone: [phone]   Fax:  [phone]

## 2014-10-11 ENCOUNTER — Ambulatory Visit: Payer: Medicaid Other

## 2014-10-17 ENCOUNTER — Encounter: Payer: Self-pay | Admitting: Physical Therapy

## 2014-10-17 ENCOUNTER — Ambulatory Visit: Payer: Medicaid Other | Admitting: Physical Therapy

## 2014-10-17 DIAGNOSIS — M5431 Sciatica, right side: Secondary | ICD-10-CM

## 2014-10-17 DIAGNOSIS — M5432 Sciatica, left side: Secondary | ICD-10-CM

## 2014-10-17 NOTE — Patient Instructions (Addendum)
   PELVIC TILT  Lie on back, legs bent. Exhale, tilting top of pelvis back, pubic bone up, to flatten lower back. Inhale, rolling pelvis opposite way, top forward, pubic bone down, arch in back. Repeat __10__ times. Do __2__ sessions per day. Copyright  VHI. All rights reserved.    Isometric Hold With Pelvic Floor (Hook-Lying)  Lie with hips and knees bent. Slowly inhale, and then exhale. Pull navel toward spine and tighten pelvic floor. Hold for __10_ seconds. Continue to breathe in and out during hold. Rest for _10__ seconds. Repeat __10_ times. Do __2-3_ times a day.   Knee Fold  Lie on back, legs bent, arms by sides. Exhale, lifting knee to chest. Inhale, returning. Keep abdominals flat, navel to spine. Repeat __10__ times, alternating legs. Do __2__ sessions per day.  Knee Drop  Keep pelvis stable. Without rotating hips, slowly drop knee to side, pause, return to center, bring knee across midline toward opposite hip. Feel obliques engaging. Repeat for ___10_ times each leg.   Copyright  VHI. All rights reserved.       Heel Slide to Straight   Slide one leg down to straight. Return. Be sure pelvis does not rock forward, tilt, rotate, or tip to side. Do _10__ times. Restabilize pelvis. Repeat with other leg. Do __1-2_ sets, __2_ times per day.  http://ss.exer.us/16   Copyright  VHI. All rights reserved.  Hip Flexor Stretch   Lying on back near edge of bed, bend one leg, foot flat. Hang other leg over edge, relaxed, thigh resting entirely on bed for __1__ minutes. Repeat _1-2___ times. Do __2__ sessions per day. Advanced Exercise: Bend knee back keeping thigh in contact with bed.  http://gt2.exer.us/347   Copyright  VHI. All rights reserved.

## 2014-10-17 NOTE — Therapy (Signed)
[place], [place], [address] Phone: [phone]   Fax:  [phone]  Physical Therapy Treatment  Patient Details  Name: Kristine Williams MRN: [id number] Date of Birth: 1991/04/23 Referring Provider:  Cheri Fowler, MD  Encounter Date: 10/17/2014      PT End of Session - 10/17/14 1323    Visit Number 5   Number of Visits 16   Date for PT Re-Evaluation 11/17/14   PT Start Time 5465   PT Stop Time 1330   PT Time Calculation (min) 45 min   Activity Tolerance Patient tolerated treatment well      Past Medical History  Diagnosis Date  . Asthma   . Irregular menses   . UTI (lower urinary tract infection)     History reviewed. No pertinent past surgical history.  There were no vitals taken for this visit.  Visit Diagnosis:  Sciatica, left  Sciatica, right      Subjective Assessment - 10/17/14 1250    Symptoms 7/10 today, pt arrived late.    Pertinent History Pt is 7 months pregnant   Pain Score 7    Pain Location Back   Pain Orientation Right   Pain Descriptors / Indicators Sharp   Pain Frequency Intermittent   Pain Relieving Factors SI belt completely relieves pain with walking.    Multiple Pain Sites No                    [facility] Adult PT Treatment/Exercise - 10/17/14 1252    Lumbar Exercises: Stretches   Hip Flexor Stretch 2 reps;30 seconds   Hip Flexor Stretch Limitations --  off table   Quadruped Mid Back Stretch 5 reps;10 seconds   Lumbar Exercises: Supine   Clam 20 reps   Clam Limitations --  x 10 bilateral and x 20 unilateral each   Heel Slides 10 reps   Bent Knee Raise 10 reps   Lumbar Exercises: Quadruped   Straight Leg Raise 5 reps;3 seconds   Opposite Arm/Leg Raise 5 reps;5 seconds   Modalities   Modalities Moist Heat   Moist Heat Therapy   Number Minutes Moist Heat 15 Minutes   Moist Heat Location --  R hip/trunk   Ankle Exercises: Stretches   Gastroc  Stretch 3 reps;30 seconds    Childs pose x 30 sec x 3    Self care: Extended education today and SIJ instability, asked to emphasize stabilization (not stretching) as she may "un-do" what she accomplishes with alignment correction.  HEP for stab.  Postural changes and resultant stress on calf mm to maintain COG.  (c/o heel pain) SI Belt and options for coverage.  Rx for SI belt given . Pt. Asked multiple times for hot pack, biofreeze.  Explained process.           PT Education - 10/17/14 1323    Education provided Yes   Education Details Stability vs stretching, HEP and hip flexor stretch, heat   Person(s) Educated Patient   Methods Explanation;Demonstration;Handout   Comprehension Verbalized understanding;Returned demonstration;Need further instruction          PT Short Term Goals - 10/17/14 1347    PT SHORT TERM GOAL #1   Title independent with HEP (10/16/14)   Status Partially Met   PT SHORT TERM GOAL #2   Title report pain < 5/10 with activity (10/16/14)   Status On-going   PT SHORT TERM GOAL #3   Status On-going  PT Long Term Goals - 10/17/14 1359    PT LONG TERM GOAL #1   Title independent with advanced HEP (11/13/14)   Status On-going   PT LONG TERM GOAL #2   Title verbalize understanding of posture/body mechanics to reduce the risk of reinjury (11/13/14)   Status On-going   PT LONG TERM GOAL #3   Title report pain < 3/10 with activity (11/13/14)   Status On-going   PT LONG TERM GOAL #4   Title lumbar flexion and extension ROM WNL for improved mobility (11/13/13)   Status On-going               Plan - 10/17/14 1324    Clinical Impression Statement Pt was given a Rx for SI belt to help with cost.  She was asked to avoid stretching spine and stick to stability ex, quadruped for pain relief. Her pain has not consistently improved but is learining concepts and making small gains towards goals.    PT Next Visit Plan Stabilization and hip flexor  stretch (prior) Avoid rotational stretching. Quad OK.    PT Home Exercise Plan stab ex (L-1) and hip flexor off bed.    Consulted and Agree with Plan of Care Patient        Problem List There are no active problems to display for this patient.   Kristine Williams 10/17/2014, 3:29 PM  [facility] [address] [place], [place], [address] Phone: [phone]   Fax:  [phone]    Raeford Razor, PT 10/17/2014 3:33 PM Phone: [phone] Fax: [phone]

## 2014-10-19 ENCOUNTER — Ambulatory Visit: Payer: Medicaid Other | Admitting: Physical Therapy

## 2014-10-19 DIAGNOSIS — M5431 Sciatica, right side: Secondary | ICD-10-CM

## 2014-10-19 DIAGNOSIS — M5432 Sciatica, left side: Secondary | ICD-10-CM

## 2014-10-19 NOTE — Therapy (Signed)
[place], [place], [address] Phone: [phone]   Fax:  [phone]  Physical Therapy Treatment  Patient Details  Name: Kristine Williams MRN: [id number] Date of Birth: 03-06-1991 Referring Provider:  Cheri Fowler, MD  Encounter Date: 10/19/2014    Past Medical History  Diagnosis Date  . Asthma   . Irregular menses   . UTI (lower urinary tract infection)     No past surgical history on file.  There were no vitals taken for this visit.  Visit Diagnosis:  No diagnosis found.      Subjective Assessment - 10/19/14 1032    Symptoms 2/10 today, pt faxed the Rx for SI belt                    [facility] Adult PT Treatment/Exercise - 10/19/14 1036    Lumbar Exercises: Stretches   Hip Flexor Stretch 2 reps;30 seconds   Hip Flexor Stretch Limitations --  done in standing with knee on chair   Pelvic Tilt 5 reps   Pelvic Tilt Limitations --  on ball   Lumbar Exercises: Supine   Clam 10 reps   Clam Limitations --  2 sets   Heel Slides 10 reps   Bent Knee Raise 10 reps   Dead Bug 10 reps  unable to lift leg leg without pain   Lumbar Exercises: Quadruped   Madcat/Old Horse 10 reps   Single Arm Raise 5 reps   Straight Leg Raise 5 reps   Opposite Arm/Leg Raise 5 reps   Other Quadruped Lumbar Exercises childs pose            PT Short Term Goals - 10/17/14 1347    PT SHORT TERM GOAL #1   Title independent with HEP (10/16/14)   Status Partially Met   PT SHORT TERM GOAL #2   Title report pain < 5/10 with activity (10/16/14)   Status On-going   PT SHORT TERM GOAL #3   Status On-going           PT Long Term Goals - 10/17/14 1359    PT LONG TERM GOAL #1   Title independent with advanced HEP (11/13/14)   Status On-going   PT LONG TERM GOAL #2   Title verbalize understanding of posture/body mechanics to reduce the risk of reinjury (11/13/14)   Status On-going   PT LONG TERM GOAL #3   Title report pain < 3/10 with activity (11/13/14)   Status On-going   PT LONG TERM GOAL #4   Title lumbar flexion and extension ROM WNL for improved mobility (11/13/13)   Status On-going               Plan - 10/19/14 1159    Clinical Impression Statement Patient had relatively low pain levels today, able to demo HEP for stab with min verbal cueing.  Discussed option for post partum pilates classes but patient will be retunring to work  rather quickly.  Declined tape today is it did not work and was painful coming off.    PT Next Visit Plan cont with POC: stab and posture re-ed   PT Home Exercise Plan as previous   Consulted and Agree with Plan of Care Patient        Problem List There are no active problems to display for this patient.   PAA,JENNIFER 10/19/2014, 12:03 PM  [facility] [address] [place], [place], [address] Phone: [phone]  Fax:  [phone]

## 2014-10-23 ENCOUNTER — Ambulatory Visit: Payer: Medicaid Other | Admitting: Physical Therapy

## 2014-10-23 DIAGNOSIS — M5432 Sciatica, left side: Secondary | ICD-10-CM

## 2014-10-23 DIAGNOSIS — M5431 Sciatica, right side: Secondary | ICD-10-CM

## 2014-10-23 NOTE — Therapy (Signed)
[place], [place], [address] Phone: [phone]   Fax:  [phone]  Physical Therapy Treatment  Patient Details  Name: Kristine Williams MRN: [id number] Date of Birth: [birth year] Referring Provider:  Cheri Fowler, MD  Encounter Date: 10/23/2014      PT End of Session - 10/23/14 1136    Visit Number 6   Number of Visits 16   Date for PT Re-Evaluation 11/17/14   PT Start Time 9628   Activity Tolerance Patient limited by pain;Patient tolerated treatment well   Behavior During Therapy [facility] for tasks assessed/performed      Past Medical History  Diagnosis Date  . Asthma   . Irregular menses   . UTI (lower urinary tract infection)     No past surgical history on file.  There were no vitals taken for this visit.  Visit Diagnosis:  Sciatica, left  Sciatica, right      Subjective Assessment - 10/23/14 1118    Symptoms Discomfort today, no pain. I feel more conscious of the way I twist.     How long can you stand comfortably? 5 min (its worse)   How long can you walk comfortably? 40-45 min   Currently in Pain? No/denies                    [facility] Adult PT Treatment/Exercise - 10/23/14 1123    Lumbar Exercises: Supine   Ab Set 10 reps  with ball squeeze   Clam 10 reps   Clam Limitations --  2 sets   Heel Slides 10 reps   Bent Knee Raise 10 reps   Dead Bug 10 reps  unable to lift leg leg without pain   Other Supine Lumbar Exercises clam with green band supine and sidelying x 10 each    Lumbar Exercises: Quadruped   Madcat/Old Horse 5 reps   Single Arm Raise 5 reps   Straight Leg Raise 5 reps   Opposite Arm/Leg Raise --  unable to lift L leg without pain on Rt. SIJ   Opposite Arm/Leg Raise Limitations --  each side     Self care: HEP and stability, questions about postur/alignment in sitting.         PT Education - 10/23/14 1135    Education provided Yes   Education  Details Stability ex   Person(s) Educated Patient   Methods Explanation;Demonstration   Comprehension Verbalized understanding          PT Short Term Goals - 10/17/14 1347    PT SHORT TERM GOAL #1   Title independent with HEP (10/16/14)   Status Partially Met   PT SHORT TERM GOAL #2   Title report pain < 5/10 with activity (10/16/14)   Status On-going   PT SHORT TERM GOAL #3   Status On-going           PT Long Term Goals - 10/17/14 1359    PT LONG TERM GOAL #1   Title independent with advanced HEP (11/13/14)   Status On-going   PT LONG TERM GOAL #2   Title verbalize understanding of posture/body mechanics to reduce the risk of reinjury (11/13/14)   Status On-going   PT LONG TERM GOAL #3   Title report pain < 3/10 with activity (11/13/14)   Status On-going   PT LONG TERM GOAL #4   Title lumbar flexion and extension ROM WNL for improved mobility (11/13/13)   Status On-going  Plan - 10/23/14 1136    Clinical Impression Statement Patient has not met goals further. SHe now has  rx for SI belt which she plans to get today.    PT Next Visit Plan cont with POC: stab and posture re-ed   PT Home Exercise Plan cont stab ex and avoid big stretches   Consulted and Agree with Plan of Care Patient        Problem List There are no active problems to display for this patient.   PAA,JENNIFER 10/23/2014, 12:42 PM  [facility] [address] [place], [place], [address] Phone: [phone]   Fax:  [phone]

## 2014-10-25 ENCOUNTER — Ambulatory Visit: Payer: Medicaid Other | Admitting: Physical Therapy

## 2014-10-25 DIAGNOSIS — M5431 Sciatica, right side: Secondary | ICD-10-CM

## 2014-10-25 DIAGNOSIS — M5432 Sciatica, left side: Secondary | ICD-10-CM

## 2014-10-25 NOTE — Therapy (Signed)
[place], [place], [address] Phone: [phone]   Fax:  [phone]  Physical Therapy Treatment  Patient Details  Name: Kristine Williams MRN: [id number] Date of Birth: 07/07/1991 Referring Provider:  Cheri Fowler, MD  Encounter Date: 10/25/2014      PT End of Session - 10/25/14 1228    Visit Number 7   Number of Visits 16   Date for PT Re-Evaluation 11/17/14   PT Start Time 1108  pt late   PT Stop Time 1205   PT Time Calculation (min) 57 min   Activity Tolerance Patient limited by fatigue      Past Medical History  Diagnosis Date  . Asthma   . Irregular menses   . UTI (lower urinary tract infection)     No past surgical history on file.  There were no vitals taken for this visit.  Visit Diagnosis:  Sciatica, left  Sciatica, right      Subjective Assessment - 10/25/14 1119    Symptoms I did my stretches today. Starts work Feb. 1, due date March 3. Pain is more muscular today, not nerve pain.    Currently in Pain? Yes   Pain Score 3    Pain Location Back   Pain Orientation Right   Pain Descriptors / Indicators Aching   Pain Type Neuropathic pain;Chronic pain   Pain Onset More than a month ago   Pain Frequency Intermittent                    [facility] Adult PT Treatment/Exercise - 10/25/14 1120    Exercises   Exercises --  NuStep Level 7 LEs only for 6 min    Lumbar Exercises: Seated   Other Seated Lumbar Exercises Pelvic tilt FW and lateral on ball, march, add arms alternating   Other Seated Lumbar Exercises horiz abd on ball red Tband  and spinal rotation   diagonal pull red band on ball x10 each   Lumbar Exercises: Supine   Other Supine Lumbar Exercises Kneeling arms on Pilates Tower   Other Supine Lumbar Exercises --  Mermaid, roll down, seated cat stretch   Lumbar Exercises: Sidelying   Clam 20 reps   Hip Abduction 10 reps   Moist Heat Therapy   Number Minutes  Moist Heat 15 Minutes   Moist Heat Location --  Rt. hip and trunk                  PT Short Term Goals - 10/17/14 1347    PT SHORT TERM GOAL #1   Title independent with HEP (10/16/14)   Status Partially Met   PT SHORT TERM GOAL #2   Title report pain < 5/10 with activity (10/16/14)   Status On-going   PT SHORT TERM GOAL #3   Status On-going           PT Long Term Goals - 10/17/14 1359    PT LONG TERM GOAL #1   Title independent with advanced HEP (11/13/14)   Status On-going   PT LONG TERM GOAL #2   Title verbalize understanding of posture/body mechanics to reduce the risk of reinjury (11/13/14)   Status On-going   PT LONG TERM GOAL #3   Title report pain < 3/10 with activity (11/13/14)   Status On-going   PT LONG TERM GOAL #4   Title lumbar flexion and extension ROM WNL for improved mobility (11/13/13)   Status On-going  Plan - 10/25/14 1229    Clinical Impression Statement Patient seems to be improving as she has stopped doing her "twisting" stretches.  Focusing on stabilization and deemphasizing unilateral motions for SIJ stability is helping her pain. Has not gotten SI blet yet.    Rehab Potential Good   PT Next Visit Plan cont with POC: stab and posture re-ed   PT Home Exercise Plan cont stab ex and avoid big stretches        Problem List There are no active problems to display for this patient.   PAA,JENNIFER 10/25/2014, 12:32 PM  [facility] [address] [place], , [address] Phone: [phone]   Fax:  [phone]      

## 2014-10-30 ENCOUNTER — Ambulatory Visit: Payer: Medicaid Other | Admitting: Physical Therapy

## 2014-10-30 ENCOUNTER — Encounter: Payer: Self-pay | Admitting: Rehabilitation

## 2014-11-01 ENCOUNTER — Ambulatory Visit: Payer: Medicaid Other | Admitting: Physical Therapy

## 2014-11-01 ENCOUNTER — Telehealth: Payer: Self-pay | Admitting: *Deleted

## 2014-11-01 DIAGNOSIS — M5432 Sciatica, left side: Secondary | ICD-10-CM

## 2014-11-01 DIAGNOSIS — M5431 Sciatica, right side: Secondary | ICD-10-CM

## 2014-11-01 NOTE — Therapy (Signed)
[place], [place], [address] Phone: [phone]   Fax:  [phone]  Physical Therapy Treatment  Patient Details  Name: Kristine Williams MRN: [id number] Date of Birth: 1991/05/19 Referring Provider:  Cheri Fowler, MD  Encounter Date: 11/01/2014      PT End of Session - 11/01/14 1323    Visit Number 8   Number of Visits 16   Date for PT Re-Evaluation 11/17/14   PT Start Time 6962  late   PT Stop Time 1155   PT Time Calculation (min) 39 min      Past Medical History  Diagnosis Date  . Asthma   . Irregular menses   . UTI (lower urinary tract infection)     No past surgical history on file.  There were no vitals taken for this visit.  Visit Diagnosis:  Sciatica, left  Sciatica, right      Subjective Assessment - 11/01/14 1119    Symptoms The snow messed me up. Im no better.  Muscle pain increased because of walking on ice and walking dog. Sharper pain Tues. Hasn't gotten SI belt yet   How long can you walk comfortably? uncomfortable the whole time.  Never "comfortable   Currently in Pain? Yes   Pain Score 6    Pain Location Back           PT Education - 11/01/14 1322    Education provided Yes   Education Details Resources for SI belt with contact info   Person(s) Educated Patient   Methods Explanation   Comprehension Verbalized understanding    Manual therapy: Pt in L sidelying with pillow under waist to decompress Rt SIJ and L spine.   Soft tissue work to Orient. Lumbar paraspinals, Rt. Gluteals and lateral SI border.   Caudal traction with LE off table to iliac crest.  Ice pack post session Rt. Hip/.back 10 min. Reports relief post. Patient advised that heat may increase nerve pain, advised to alternate cold/heat as a solution.      PT Short Term Goals - 10/17/14 1347    PT SHORT TERM GOAL #1   Title independent with HEP (10/16/14)   Status Partially Met   PT SHORT TERM GOAL #2    Title report pain < 5/10 with activity (10/16/14)   Status On-going   PT SHORT TERM GOAL #3   Status On-going           PT Long Term Goals - 10/17/14 1359    PT LONG TERM GOAL #1   Title independent with advanced HEP (11/13/14)   Status On-going   PT LONG TERM GOAL #2   Title verbalize understanding of posture/body mechanics to reduce the risk of reinjury (11/13/14)   Status On-going   PT LONG TERM GOAL #3   Title report pain < 3/10 with activity (11/13/14)   Status On-going   PT LONG TERM GOAL #4   Title lumbar flexion and extension ROM WNL for improved mobility (11/13/13)   Status On-going               Plan - 11/01/14 1325    Clinical Impression Statement Patient continues to have pain and difficulty with turning over in bed, getting up in the am. She has not met goals further.  If no improvement seen in 2-3 more sessions, consider DC.     Pt will benefit from skilled therapeutic intervention in order to improve on the following deficits Abnormal gait;Decreased  mobility;Decreased strength;Pain;Postural dysfunction;Improper body mechanics;Decreased range of motion;Difficulty walking;Impaired flexibility   Rehab Potential Good   PT Next Visit Plan assess soft tissue work, ice   PT Home Exercise Plan cont stab ex and avoid big stretches   Consulted and Agree with Plan of Care Patient        Problem List There are no active problems to display for this patient.   Tabitha Riggins 11/01/2014, 2:51 PM  [facility] [address] [place], [place], [address] Phone: [phone]   Fax:  [phone]  Raeford Razor, PT 11/01/2014 2:57 PM Phone: [phone] Fax: [phone]

## 2014-11-07 ENCOUNTER — Ambulatory Visit: Payer: Medicaid Other | Attending: Obstetrics and Gynecology | Admitting: Physical Therapy

## 2014-11-07 DIAGNOSIS — M5432 Sciatica, left side: Secondary | ICD-10-CM | POA: Insufficient documentation

## 2014-11-07 DIAGNOSIS — M5431 Sciatica, right side: Secondary | ICD-10-CM | POA: Diagnosis not present

## 2014-11-07 NOTE — Patient Instructions (Signed)
To wear shoes in the house for shock absorption.  Do's and dont's for SI issued, and reviewed.  Patient verbalized her understanding.

## 2014-11-07 NOTE — Therapy (Addendum)
[place], [place], [address] Phone: [phone]   Fax:  [phone]  Physical Therapy Treatment  Patient Details  Name: Kristine Williams MRN: [id number] Date of Birth: 1991/05/14 Referring Provider:  Cheri Fowler, MD  Encounter Date: 11/07/2014      PT End of Session - 11/07/14 1809    Visit Number 9   Number of Visits 16   Date for PT Re-Evaluation 11/17/14   PT Start Time 1600   PT Stop Time 4166   PT Time Calculation (min) 45 min   Activity Tolerance Patient tolerated treatment well;Patient limited by pain;Patient limited by fatigue      Past Medical History  Diagnosis Date  . Asthma   . Irregular menses   . UTI (lower urinary tract infection)     No past surgical history on file.  There were no vitals taken for this visit.  Visit Diagnosis:  Sciatica, right  Sciatica, left      Subjective Assessment - 11/07/14 1709    Symptoms I have had a bad day, No SI belt Yet a regular belt helps    Pain Score --  Could not tell me her pain score   Pain Location Back   Pain Orientation Right   Pain Descriptors / Indicators Burning;Aching   Pain Radiating Towards Hips   Pain Frequency Intermittent   Aggravating Factors  Has more pain at home, may be due to walking without shoes.  (Very Flat feet)   Pain Relieving Factors SI belt, some exercise   Effect of Pain on Daily Activities ADL's difficult   Multiple Pain Sites No                    [facility] Adult PT Treatment/Exercise - 11/07/14 1555    Lumbar Exercises: Supine   Ab Set 10 reps   Clam 10 reps  irritating   Heel Slides 10 reps   Bent Knee Raise 10 reps   Bridge --  to check ASIS 3-4 times during session   Other Supine Lumbar Exercises Ball squeeze, not comfortable,   Other Supine Lumbar Exercises Clam into belt helped ease her pain, added to home exercises   Lumbar Exercises: Sidelying   Clam 10 reps  both sided with  instruction   Hip Abduction 5 reps   Lumbar Exercises: Quadruped   Single Arm Raise 5 reps   Straight Leg Raise 5 reps                PT Education - 11/07/14 1809    Education provided Yes   Education Details SI do/do nots, self care   Person(s) Educated Patient   Methods Explanation;Handout   Comprehension Verbalized understanding          PT Short Term Goals - 11/07/14 1812    PT SHORT TERM GOAL #1   Title independent with HEP (10/16/14)   Time 4   Period Weeks   PT SHORT TERM GOAL #2   Title report pain < 5/10 with activity (10/16/14)   Time 4   Period Weeks   Status On-going   PT SHORT TERM GOAL #3   Title improve lumbar flexion and ext by 5 degrees for improved flexibility (10/16/14)   Time 4   Period Weeks   Status Unable to assess           PT Long Term Goals - 11/07/14 1813    PT LONG TERM GOAL #1  Title independent with advanced HEP (11/13/14)   Time 8   Period Weeks   Status On-going   PT LONG TERM GOAL #2   Title verbalize understanding of posture/body mechanics to reduce the risk of reinjury (11/13/14)   Time 8   Period Weeks   Status On-going   PT LONG TERM GOAL #3   Title report pain < 3/10 with activity (11/13/14)   Time 8   Period Weeks   Status On-going   PT LONG TERM GOAL #4   Title lumbar flexion and extension ROM WNL for improved mobility (11/13/13)   Time 8   Period Weeks   Status Unable to assess               Plan - 11/07/14 1810    Clinical Impression Statement Able to continue education for self care.  pain improved today due to wearing shoes with cushions?  Patient wants to continue 1 more visit if able.  Schedule tight with her new job.   PT Next Visit Plan stabilization,    Consulted and Agree with Plan of Care Patient        Problem List There are no active problems to display for this patient. Melvenia Needles, PTA 11/07/2014 6:14 PM Phone: [phone] Fax: [phone]   Melvenia Needles 11/07/2014, [facility] [address] [place], [place], [address] Phone: [phone]   Fax:  [phone]       PHYSICAL THERAPY DISCHARGE SUMMARY  Visits from Start of Care: 9  Current functional level related to goals / functional outcomes: See above; pt did not return due to delivering baby   Remaining deficits: unknown   Education / Equipment: HEP  Plan: Patient agrees to discharge.  Patient goals were not met. Patient is being discharged due to not returning since the last visit.  ?????    Laureen Abrahams, PT, DPT 08/19/2015 4:20 PM  [facility] [address] [place] [address]  [phone] (office) [phone] (fax)

## 2014-11-14 ENCOUNTER — Ambulatory Visit: Payer: Medicaid Other | Admitting: Physical Therapy

## 2014-11-15 LAB — OB RESULTS CONSOLE GBS: STREP GROUP B AG: NEGATIVE

## 2014-11-30 NOTE — H&P (Signed)
Kristine KernOksana Vilardi is a 24 y.o. female G1P0 at 39+ weeks (EDD 12/06/14 by LMP c/w 12 weeks US) presenting for IOL with 24 hour urine indicating 471 mg of protein in 24 hours and BP elevated to 140/90-100 c/w mild preeclampsia. Prenatal care significant for Sickle C trait carrier.  She is also GBS positive.  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  The patient has been unsure she wished to proceed with IOL but three different doctors explained to her that with preeclampsia developing it is best to get the baby delivered before it gets to the severe range.  She is now willing to proceed.   Maternal Medical History:  Contractions: Frequency: irregular.   Perceived severity is mild.    Fetal activity: Perceived fetal activity is normal.    Prenatal complications: Pre-eclampsia.   Prenatal Complications - Diabetes: none.    OB History    No data available     Past Medical History  Diagnosis Date  . Asthma   . Irregular menses   . UTI (lower urinary tract infection)    No past surgical history on file. Family History: family history includes Diabetes in her other; Hypertension in her other. Social History:  reports that she has never smoked. She does not have any smokeless tobacco history on file. She reports that she does not drink alcohol or use illicit drugs.   Prenatal Transfer Tool  Maternal Diabetes: No Genetic Screening: Normal Maternal Ultrasounds/Referrals: Normal Fetal Ultrasounds or other Referrals:  None Maternal Substance Abuse:  No Significant Maternal Medications:  None Significant Maternal Lab Results:  Lab values include: Group B Strep positive Other Comments:  None  Review of Systems  Gastrointestinal: Negative for abdominal pain.  Neurological: Negative for headaches.      There were no vitals taken for this visit. Exam Physical Exam  Constitutional: She appears well-developed and well-nourished.  Cardiovascular: Normal  rate.   Respiratory: Effort normal.  GI: Soft.  Genitourinary: Vagina normal and uterus normal.  Neurological: She is alert.  Psychiatric: She has a normal mood and affect.    Prenatal labs: ABO, Rh:  A positve Antibody:  negative Rubella:  Immune RPR:   NR HBsAg:   Neg HIV:   Neg GBS:   Positive One hour GTT 64 and 77 First trimester screen WNL Hgb AC  Assessment/Plan: Pt admitted with probable mild preeclampsia for IOL.  Will follow BP and check labs for any abnormalities.  PCN for +GBS.  Plan pitocin and AROM.  Oliver PilaRICHARDSON,Neve Branscomb W 12/01/2014, 7:32 AM

## 2014-12-01 ENCOUNTER — Encounter (HOSPITAL_COMMUNITY): Payer: Self-pay

## 2014-12-01 ENCOUNTER — Inpatient Hospital Stay (HOSPITAL_COMMUNITY)
Admission: RE | Admit: 2014-12-01 | Discharge: 2014-12-03 | DRG: 774 | Disposition: A | Discharge status: Home / Self Care | Payer: Medicaid Other | Source: Ambulatory Visit | Attending: Obstetrics and Gynecology | Admitting: Obstetrics and Gynecology

## 2014-12-01 DIAGNOSIS — O99824 Streptococcus B carrier state complicating childbirth: Secondary | ICD-10-CM | POA: Diagnosis present

## 2014-12-01 DIAGNOSIS — O1403 Mild to moderate pre-eclampsia, third trimester: Secondary | ICD-10-CM | POA: Diagnosis present

## 2014-12-01 DIAGNOSIS — Z3A39 39 weeks gestation of pregnancy: Secondary | ICD-10-CM | POA: Diagnosis present

## 2014-12-01 DIAGNOSIS — Z8249 Family history of ischemic heart disease and other diseases of the circulatory system: Secondary | ICD-10-CM

## 2014-12-01 DIAGNOSIS — O99013 Anemia complicating pregnancy, third trimester: Secondary | ICD-10-CM | POA: Diagnosis present

## 2014-12-01 DIAGNOSIS — D573 Sickle-cell trait: Secondary | ICD-10-CM | POA: Diagnosis present

## 2014-12-01 DIAGNOSIS — Z833 Family history of diabetes mellitus: Secondary | ICD-10-CM

## 2014-12-01 DIAGNOSIS — O1490 Unspecified pre-eclampsia, unspecified trimester: Secondary | ICD-10-CM | POA: Diagnosis present

## 2014-12-01 LAB — COMPREHENSIVE METABOLIC PANEL
ALT: 16 U/L (ref 0–35)
ANION GAP: 3 — AB (ref 5–15)
AST: 24 U/L (ref 0–37)
Albumin: 2.8 g/dL — ABNORMAL LOW (ref 3.5–5.2)
Alkaline Phosphatase: 214 U/L — ABNORMAL HIGH (ref 39–117)
BUN: 11 mg/dL (ref 6–23)
CHLORIDE: 108 mmol/L (ref 96–112)
CO2: 20 mmol/L (ref 19–32)
Calcium: 8.3 mg/dL — ABNORMAL LOW (ref 8.4–10.5)
Creatinine, Ser: 0.78 mg/dL (ref 0.50–1.10)
GFR calc Af Amer: >90 mL/min (ref >90)
GFR calc non Af Amer: >90 mL/min (ref >90)
GLUCOSE: 72 mg/dL (ref 70–99)
Potassium: 4 mmol/L (ref 3.5–5.1)
SODIUM: 131 mmol/L — AB (ref 135–145)
TOTAL PROTEIN: 5.9 g/dL — AB (ref 6.0–8.3)
Total Bilirubin: 0.3 mg/dL (ref 0.3–1.2)

## 2014-12-01 LAB — CBC
HEMATOCRIT: 32.8 % — AB (ref 36.0–46.0)
Hemoglobin: 11.4 g/dL — ABNORMAL LOW (ref 12.0–15.0)
MCH: 25.9 pg — AB (ref 26.0–34.0)
MCHC: 34.8 g/dL (ref 30.0–36.0)
MCV: 74.4 fL — ABNORMAL LOW (ref 78.0–100.0)
Platelets: 199 10*3/uL (ref 150–400)
RBC: 4.41 MIL/uL (ref 3.87–5.11)
RDW: 13.8 % (ref 11.5–15.5)
WBC: 5.6 10*3/uL (ref 4.0–10.5)

## 2014-12-01 LAB — PLATELET COUNT: Platelets: 190 10*3/uL (ref 150–400)

## 2014-12-01 LAB — ABO/RH: ABO/RH(D): A POS

## 2014-12-01 LAB — OB RESULTS CONSOLE GC/CHLAMYDIA
CHLAMYDIA, DNA PROBE: NEGATIVE
GC PROBE AMP, GENITAL: NEGATIVE

## 2014-12-01 LAB — OB RESULTS CONSOLE ANTIBODY SCREEN: Antibody Screen: NEGATIVE

## 2014-12-01 LAB — OB RESULTS CONSOLE HIV ANTIBODY (ROUTINE TESTING): HIV: NONREACTIVE

## 2014-12-01 LAB — OB RESULTS CONSOLE ABO/RH: RH Type: POSITIVE

## 2014-12-01 LAB — OB RESULTS CONSOLE RPR: RPR: NONREACTIVE

## 2014-12-01 MED ORDER — OXYTOCIN 40 UNITS IN LACTATED RINGERS INFUSION - SIMPLE MED: 62.5000 mL/h | INTRAVENOUS | Status: DC

## 2014-12-01 MED ORDER — ONDANSETRON HCL 4 MG/2ML IJ SOLN
4.0000 mg | Freq: Four times a day (QID) | INTRAMUSCULAR | Status: DC | PRN
  Filled 2014-12-01: qty 2

## 2014-12-01 MED ORDER — LACTATED RINGERS IV SOLN
INTRAVENOUS | Status: DC
  Administered 2014-12-01: 1000 mL via INTRAVENOUS
  Administered 2014-12-01: 950 mL via INTRAVENOUS

## 2014-12-01 MED ORDER — DIPHENHYDRAMINE HCL 50 MG/ML IJ SOLN: 12.5000 mg | INTRAMUSCULAR | Status: DC | PRN

## 2014-12-01 MED ORDER — LACTATED RINGERS IV SOLN
500.0000 mL | INTRAVENOUS | Status: DC | PRN
  Administered 2014-12-01: 500 mL via INTRAVENOUS

## 2014-12-01 MED ORDER — OXYTOCIN 40 UNITS IN LACTATED RINGERS INFUSION - SIMPLE MED
1.0000 m[IU]/min | INTRAVENOUS | Status: DC
  Administered 2014-12-01: 6 m[IU]/min via INTRAVENOUS
  Administered 2014-12-01: 4 m[IU]/min via INTRAVENOUS
  Administered 2014-12-01: 2 m[IU]/min via INTRAVENOUS
  Filled 2014-12-01: qty 1000

## 2014-12-01 MED ORDER — LIDOCAINE HCL (PF) 1 % IJ SOLN
30.0000 mL | INTRAMUSCULAR | Status: DC | PRN
  Filled 2014-12-01: qty 30

## 2014-12-01 MED ORDER — BENZOCAINE-MENTHOL 20-0.5 % EX AERO
1.0000 "application " | INHALATION_SPRAY | CUTANEOUS | Status: DC | PRN
  Administered 2014-12-02: 1 via TOPICAL
  Filled 2014-12-01: qty 56

## 2014-12-01 MED ORDER — DIPHENHYDRAMINE HCL 25 MG PO CAPS: 25.0000 mg | ORAL_CAPSULE | Freq: Four times a day (QID) | ORAL | Status: DC | PRN

## 2014-12-01 MED ORDER — PENICILLIN G POTASSIUM 5000000 UNITS IJ SOLR
5.0000 10*6.[IU] | Freq: Once | INTRAVENOUS | Status: AC
  Administered 2014-12-01: 5 10*6.[IU] via INTRAVENOUS
  Filled 2014-12-01: qty 5

## 2014-12-01 MED ORDER — IBUPROFEN 600 MG PO TABS
600.0000 mg | ORAL_TABLET | Freq: Four times a day (QID) | ORAL | Status: DC
  Administered 2014-12-01 – 2014-12-03 (×8): 600 mg via ORAL
  Filled 2014-12-01 (×8): qty 1

## 2014-12-01 MED ORDER — EPHEDRINE 5 MG/ML INJ
10.0000 mg | INTRAVENOUS | Status: DC | PRN
  Filled 2014-12-01: qty 2

## 2014-12-01 MED ORDER — DIBUCAINE 1 % RE OINT: 1.0000 "application " | TOPICAL_OINTMENT | RECTAL | Status: DC | PRN

## 2014-12-01 MED ORDER — BUTORPHANOL TARTRATE 1 MG/ML IJ SOLN
1.0000 mg | INTRAMUSCULAR | Status: DC | PRN
  Administered 2014-12-01: 1 mg via INTRAVENOUS
  Filled 2014-12-01 (×2): qty 1

## 2014-12-01 MED ORDER — LANOLIN HYDROUS EX OINT: TOPICAL_OINTMENT | CUTANEOUS | Status: DC | PRN

## 2014-12-01 MED ORDER — OXYCODONE-ACETAMINOPHEN 5-325 MG PO TABS: 1.0000 | ORAL_TABLET | ORAL | Status: DC | PRN

## 2014-12-01 MED ORDER — FENTANYL 2.5 MCG/ML BUPIVACAINE 1/10 % EPIDURAL INFUSION (WH - ANES): 14.0000 mL/h | INTRAMUSCULAR | Status: DC | PRN

## 2014-12-01 MED ORDER — ACETAMINOPHEN 325 MG PO TABS: 650.0000 mg | ORAL_TABLET | ORAL | Status: DC | PRN

## 2014-12-01 MED ORDER — WITCH HAZEL-GLYCERIN EX PADS: 1.0000 "application " | MEDICATED_PAD | CUTANEOUS | Status: DC | PRN

## 2014-12-01 MED ORDER — ZOLPIDEM TARTRATE 5 MG PO TABS: 5.0000 mg | ORAL_TABLET | Freq: Every evening | ORAL | Status: DC | PRN

## 2014-12-01 MED ORDER — CITRIC ACID-SODIUM CITRATE 334-500 MG/5ML PO SOLN
30.0000 mL | ORAL | Status: DC | PRN
Start: 2014-12-01 — End: 2014-12-01

## 2014-12-01 MED ORDER — ONDANSETRON HCL 4 MG PO TABS: 4.0000 mg | ORAL_TABLET | ORAL | Status: DC | PRN

## 2014-12-01 MED ORDER — LACTATED RINGERS IV SOLN: 500.0000 mL | Freq: Once | INTRAVENOUS | Status: DC

## 2014-12-01 MED ORDER — PHENYLEPHRINE 40 MCG/ML (10ML) SYRINGE FOR IV PUSH (FOR BLOOD PRESSURE SUPPORT)
80.0000 ug | PREFILLED_SYRINGE | INTRAVENOUS | Status: DC | PRN
  Filled 2014-12-01: qty 2

## 2014-12-01 MED ORDER — SENNOSIDES-DOCUSATE SODIUM 8.6-50 MG PO TABS
2.0000 | ORAL_TABLET | ORAL | Status: DC
  Administered 2014-12-01 – 2014-12-03 (×2): 2 via ORAL
  Filled 2014-12-01 (×2): qty 2

## 2014-12-01 MED ORDER — OXYCODONE-ACETAMINOPHEN 5-325 MG PO TABS: 2.0000 | ORAL_TABLET | ORAL | Status: DC | PRN

## 2014-12-01 MED ORDER — TERBUTALINE SULFATE 1 MG/ML IJ SOLN
0.2500 mg | Freq: Once | INTRAMUSCULAR | Status: DC | PRN
  Filled 2014-12-01: qty 1

## 2014-12-01 MED ORDER — PRENATAL MULTIVITAMIN CH
1.0000 | ORAL_TABLET | Freq: Every day | ORAL | Status: DC
Start: 2014-12-02 — End: 2014-12-03
  Administered 2014-12-02 – 2014-12-03 (×2): 1 via ORAL
  Filled 2014-12-01 (×2): qty 1

## 2014-12-01 MED ORDER — ONDANSETRON HCL 4 MG/2ML IJ SOLN: 4.0000 mg | INTRAMUSCULAR | Status: DC | PRN

## 2014-12-01 MED ORDER — OXYTOCIN BOLUS FROM INFUSION: 500.0000 mL | INTRAVENOUS | Status: DC

## 2014-12-01 MED ORDER — SIMETHICONE 80 MG PO CHEW
80.0000 mg | CHEWABLE_TABLET | ORAL | Status: DC | PRN
Start: 2014-12-01 — End: 2014-12-03

## 2014-12-01 MED ORDER — TETANUS-DIPHTH-ACELL PERTUSSIS 5-2.5-18.5 LF-MCG/0.5 IM SUSP: 0.5000 mL | Freq: Once | INTRAMUSCULAR | Status: DC

## 2014-12-01 MED ORDER — BUTORPHANOL TARTRATE 1 MG/ML IJ SOLN
1.0000 mg | Freq: Once | INTRAMUSCULAR | Status: AC
Start: 2014-12-01 — End: 2014-12-01
  Administered 2014-12-01: 1 mg via INTRAVENOUS

## 2014-12-01 MED ORDER — DEXTROSE 5 % IV SOLN
2.5000 10*6.[IU] | INTRAVENOUS | Status: DC
  Administered 2014-12-01: 2.5 10*6.[IU] via INTRAVENOUS
  Filled 2014-12-01 (×5): qty 2.5

## 2014-12-01 NOTE — Progress Notes (Signed)
Patient ID: Randol KernOksana Williams, female   DOB: 1991/09/08, 24 y.o.   MRN: [id number] Pt admitted and PCN on board for +GBS Feeling no significant contractions Multiple questions answered regarding the induction process BP 130-140/90's Cervix 50/2-3/-2 AROM clear  Pitocin beginning and will follow progress.

## 2014-12-02 ENCOUNTER — Encounter (HOSPITAL_COMMUNITY): Payer: Self-pay

## 2014-12-02 LAB — CBC
HCT: 29.2 % — ABNORMAL LOW (ref 36.0–46.0)
HEMOGLOBIN: 10.4 g/dL — AB (ref 12.0–15.0)
MCH: 26.6 pg (ref 26.0–34.0)
MCHC: 35.6 g/dL (ref 30.0–36.0)
MCV: 74.7 fL — ABNORMAL LOW (ref 78.0–100.0)
Platelets: 168 10*3/uL (ref 150–400)
RBC: 3.91 MIL/uL (ref 3.87–5.11)
RDW: 13.8 % (ref 11.5–15.5)
WBC: 9.3 10*3/uL (ref 4.0–10.5)

## 2014-12-02 LAB — RPR: RPR Ser Ql: NONREACTIVE

## 2014-12-02 NOTE — Discharge Summary (Signed)
Obstetric Discharge Summary Reason for Admission: induction of labor Prenatal Procedures: NST and Preeclampsia Intrapartum Procedures: spontaneous vaginal delivery Postpartum Procedures: none Complications-Operative and Postpartum: small second degree perineal laceration HEMOGLOBIN  Date Value Ref Range Status  12/02/2014 10.4* 12.0 - 15.0 g/dL Final   HCT  Date Value Ref Range Status  12/02/2014 29.2* 36.0 - 46.0 % Final    Physical Exam:  General: alert and cooperative Lochia: appropriate Uterine Fundus: firm   Discharge Diagnoses: Term Pregnancy-delivered  Discharge Information: Date: 12/03/2014 Activity: pelvic rest Diet: routine Medications: Ibuprofen Condition: improved Instructions: refer to practice specific booklet Discharge to: home Follow-up Information    Follow up with Oliver PilaICHARDSON,Charan Prieto W, MD In 6 weeks.   Specialty:  Obstetrics and Gynecology   Why:  postpartum   Contact information:   [address] [place] [id number] [phone]       Newborn Data: Live born female  Birth Weight: 6 lb 8 oz (2948 g) APGAR: 8, 9  Home with mother.  Oliver PilaICHARDSON,Margerite Impastato W 12/03/2014, 11:20 AM

## 2014-12-02 NOTE — Progress Notes (Signed)
Post Partum Day 1 Subjective: no complaints and tolerating PO  Objective: Blood pressure 127/61, pulse 57, temperature 98.3 F (36.8 C), temperature source Oral, resp. rate 18, height 5\' 10"  (1.778 m), weight 116.121 kg (256 lb), SpO2 97 %, unknown if currently breastfeeding.  Physical Exam:  General: alert and cooperative Lochia: appropriate Uterine Fundus: firm    Recent Labs  12/01/14 0755 12/02/14 0500  HGB 11.4* 10.4*  HCT 32.8* 29.2*    Assessment/Plan: Plan for discharge tomorrow  BP normal   LOS: 1 day   Tyler Cubit W 12/02/2014, 8:28 AM

## 2014-12-02 NOTE — Progress Notes (Signed)
Pt requested Milk allergy information be removed from medical record stating " I have a intolerance if I drink about a gallon of milk a day otherwise I am ok. I am able to drink milk and have foods that contain milk". Allergy information removed.

## 2014-12-02 NOTE — Lactation Note (Signed)
This note was copied from the chart of Kristine Williams. Lactation Consultation Note  Patient Name: Kristine Williams ZOXWR'UToday's Date: 12/02/2014 Reason for consult: Initial assessment  Baby is 24 hour old and has been to the breast several times. Presently crying after diaper  Change. ( dad changed a wet diaper ). LC assisted mom with latch on the right breast in football position.  LC reviewed basics , breast massage, hand express, and several drops of colostrum noted .  LC assisted with positioning baby with depth , baby opens wide and latched well with depth and fed 15 mins  With multiply swallows, increased with breast compressions, mom and baby were comfortable with feeding. Nipple appeared normal when baby released off the breast. Baby sleepy and mom placed baby skin to skin on her chest. Mom asked many breast feeding questions ( LC answered them and worked on the breast feeding education). Per mom will have a DEBP at home. Mom also wondering about preparing to go back to work at 6 weeks . LC discussed keeping breast feeding simple in the beginning if at all possible and when milk starts coming in if to full to start  Hand express or pre-pump only if needed . Always soften 1st breast well before offering the 2nd breast. If the baby only feeds On one breast , release the other down to comfort. Per mom concerned she only has 6 weeks off. LC recommended if mom  Is concerned she needs to get started early in preparation to return back to work , post pump after 2 feedings 10 mins , after baby is  Settled and only when baby isn't cluster feeding. Save milk and freeze. Also discussed sore nipple and engorgement prevention and tx.  Referring Baby and me Booklet. Pages 24-25.    Maternal Data Has patient been taught Hand Expression?: Yes  Feeding Feeding Type: Breast Fed Length of feed: 15 min  LATCH Score/Interventions Latch: Grasps breast easily, tongue down, lips flanged, rhythmical  sucking.  Audible Swallowing: Spontaneous and intermittent  Type of Nipple: Everted at rest and after stimulation  Comfort (Breast/Nipple): Soft / non-tender     Hold (Positioning): Assistance needed to correctly position infant at breast and maintain latch. Intervention(s): Breastfeeding basics reviewed;Support Pillows;Position options;Skin to skin  LATCH Score: 9  Lactation Tools Discussed/Used     Consult Status Consult Status: Follow-up Date: 12/03/14 Follow-up type: In-patient    Kathrin Greathouseorio, Stephaun Million Ann 12/02/2014, 2:54 PM

## 2014-12-03 LAB — TYPE AND SCREEN
ABO/RH(D): A POS
Antibody Screen: NEGATIVE

## 2014-12-03 MED ORDER — IBUPROFEN 600 MG PO TABS: 600.0000 mg | ORAL_TABLET | Freq: Four times a day (QID) | ORAL | Status: AC

## 2014-12-03 NOTE — Progress Notes (Signed)
Post Partum Day 2 Subjective: no complaints and tolerating PO  Objective: Blood pressure 131/59, pulse 54, temperature 98.4 F (36.9 C), temperature source Oral, resp. rate 17, height 5\' 10"  (1.778 m), weight 116.121 kg (256 lb), SpO2 97 %, unknown if currently breastfeeding.  Physical Exam:  General: alert and cooperative Lochia: appropriate Uterine Fundus: firm    Recent Labs  12/01/14 0755 12/02/14 0500  HGB 11.4* 10.4*  HCT 32.8* 29.2*    Assessment/Plan: Breastfeeding  Discharge home   LOS: 2 days   Halley Kincer W 12/03/2014, 11:19 AM

## 2014-12-03 NOTE — Progress Notes (Signed)
UR chart review completed.  

## 2014-12-03 NOTE — Lactation Note (Signed)
This note was copied from the chart of Kristine Williams Drohan. Lactation Consultation Note; Mom reports that baby has been feeding a lot through the night- fed for 3 hours. Reassurance given. Baby asleep at mom's side at present. Reports nipples feel fine with all that nursing. Has manual pump in room- asking if she needs to pump- encouraged to sleep when baby is sleeping. Manual pump set up and instructions for use and cleaning given. No further questions at present. To call prn.  Patient Name: Kristine Williams [place] ZOXWR'UToday's Date: 12/03/2014 Reason for consult: Follow-up assessment   Maternal Data Formula Feeding for Exclusion: No Has patient been taught Hand Expression?: Yes Does the patient have breastfeeding experience prior to this delivery?: No  Feeding   LATCH Score/Interventions                      Lactation Tools Discussed/Used     Consult Status Consult Status: Complete    Pamelia HoitWeeks, Marcellene Shivley D 12/03/2014, 8:17 AM
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
## 2015-05-13 ENCOUNTER — Emergency Department (HOSPITAL_COMMUNITY)
Admission: EM | Admit: 2015-05-13 | Discharge: 2015-05-13 | Disposition: A | Discharge status: Home / Self Care | Payer: Medicaid Other | Attending: Emergency Medicine | Admitting: Emergency Medicine

## 2015-05-13 ENCOUNTER — Encounter (HOSPITAL_COMMUNITY): Payer: Self-pay | Admitting: Emergency Medicine

## 2015-05-13 DIAGNOSIS — Y9289 Other specified places as the place of occurrence of the external cause: Secondary | ICD-10-CM | POA: Diagnosis not present

## 2015-05-13 DIAGNOSIS — Y9389 Activity, other specified: Secondary | ICD-10-CM | POA: Diagnosis not present

## 2015-05-13 DIAGNOSIS — Z79899 Other long term (current) drug therapy: Secondary | ICD-10-CM | POA: Insufficient documentation

## 2015-05-13 DIAGNOSIS — X58XXXA Exposure to other specified factors, initial encounter: Secondary | ICD-10-CM | POA: Diagnosis not present

## 2015-05-13 DIAGNOSIS — Z8742 Personal history of other diseases of the female genital tract: Secondary | ICD-10-CM | POA: Diagnosis not present

## 2015-05-13 DIAGNOSIS — S79911A Unspecified injury of right hip, initial encounter: Secondary | ICD-10-CM | POA: Diagnosis not present

## 2015-05-13 DIAGNOSIS — J45909 Unspecified asthma, uncomplicated: Secondary | ICD-10-CM | POA: Insufficient documentation

## 2015-05-13 DIAGNOSIS — Z87440 Personal history of urinary (tract) infections: Secondary | ICD-10-CM | POA: Diagnosis not present

## 2015-05-13 DIAGNOSIS — Y990 Civilian activity done for income or pay: Secondary | ICD-10-CM | POA: Insufficient documentation

## 2015-05-13 DIAGNOSIS — M25551 Pain in right hip: Secondary | ICD-10-CM

## 2015-05-13 MED ORDER — NAPROXEN 500 MG PO TABS: 500.0000 mg | ORAL_TABLET | Freq: Two times a day (BID) | ORAL | Status: AC

## 2015-05-13 NOTE — ED Provider Notes (Signed)
CSN: [id number]     Arrival date & time 05/13/15  1705 History  This chart was scribed for Santiago Glad, PA-C, working with Blake Divine, MD by Chestine Spore, ED Scribe. The patient was seen in room WTR7/WTR7 at 5:41 PM.     Chief Complaint  Patient presents with  . Hip Pain    R hip pain, felt a "pop"      The history is provided by the patient. No language interpreter was used.    HPI Comments: Kristine Williams is a 24 y.o. female who presents to the Emergency Department complaining of right hip pain onset PTA.  Pt notes that she was pushing a WOW at work that wouldn't turn and she heard her right hip pop. Pt was able to continue doing her job after the incident. Pt notes that there is pain with pressure and twisting. She states that she has not tried any medications for the relief for her symptoms. Pt denies numbness, tingling, weakness, gait problem, color change, rash, wound, and any other symptoms.    Past Medical History  Diagnosis Date  . Asthma   . Irregular menses   . UTI (lower urinary tract infection)    History reviewed. No pertinent past surgical history. Family History  Problem Relation Age of Onset  . Diabetes Other   . Hypertension Other    History  Substance Use Topics  . Smoking status: Never Smoker   . Smokeless tobacco: Not on file  . Alcohol Use: No   OB History    Gravida Para Term Preterm AB TAB SAB Ectopic Multiple Living   1 1 1       0 1     Review of Systems  Musculoskeletal: Positive for arthralgias. Negative for gait problem.  Skin: Negative for color change, rash and wound.  Neurological: Negative for weakness and numbness.       No tingling      Allergies  Other  Home Medications   Prior to Admission medications   Medication Sig Start Date End Date Taking? Authorizing Provider  ibuprofen (ADVIL,MOTRIN) 600 MG tablet Take 1 tablet (600 mg total) by mouth every 6 (six) hours. 12/03/14   Huel Cote, MD  Prenatal Vit-Fe  Fumarate-FA (PRENATAL MULTIVITAMIN) TABS tablet Take 1 tablet by mouth daily.    Historical Provider, MD   BP 128/85 mmHg  Pulse 96  Temp(Src) 97.8 F (36.6 C) (Oral)  Resp 18  Ht 5\' 10"  (1.778 m)  Wt 208 lb (94.348 kg)  BMI 29.84 kg/m2  SpO2 99% Physical Exam  Constitutional: She is oriented to person, place, and time. She appears well-developed and well-nourished. No distress.  HENT:  Head: Normocephalic and atraumatic.  Eyes: EOM are normal.  Neck: Neck supple. No tracheal deviation present.  Cardiovascular: Normal rate and regular rhythm.   Pulses:      Dorsalis pedis pulses are 2+ on the right side.  Pulmonary/Chest: Effort normal and breath sounds normal. No respiratory distress.  Musculoskeletal: Normal range of motion.  Right hip: Mild TTP with no bruising, edema, erythema, or warmth. Pain with adduction worse than with abduction. Full ROM of right hip.   Neurological: She is alert and oriented to person, place, and time.  Skin: Skin is warm and dry.  Psychiatric: She has a normal mood and affect. Her behavior is normal.  Nursing note and vitals reviewed.   ED Course  Procedures (including critical care time) DIAGNOSTIC STUDIES: Oxygen Saturation is 99% on RA, nl  by my interpretation.    COORDINATION OF CARE: 5:45 PM- Patient was offered an x-ray of her right hip, to which she declined at this time.   5:47 PM Discussed treatment plan with pt which includes anti-inflammatory Rx and ice at bedside and pt agreed to plan.   Labs Review Labs Reviewed - No data to display  Imaging Review No results found.   EKG Interpretation None      MDM   Final diagnoses:  Right hip pain  Patient presents today with right hip pain.  She states that she felt her hip pop while at work.  She has full ROM of the hip on exam.  She is ambulatory.  Neurovascularly intact.  Feel that the patient is stable for discharge.  Return precautions given.    I personally performed the  services described in this documentation, which was scribed in my presence. The recorded information has been reviewed and is accurate.    Santiago Glad, PA-C 05/13/15 2219  Blake Divine, MD 05/16/15 [phone]

## 2015-08-24 ENCOUNTER — Emergency Department (HOSPITAL_COMMUNITY)
Admission: EM | Admit: 2015-08-24 | Discharge: 2015-08-24 | Disposition: A | Discharge status: Home / Self Care | Payer: Medicaid Other | Attending: Emergency Medicine | Admitting: Emergency Medicine

## 2015-08-24 ENCOUNTER — Encounter (HOSPITAL_COMMUNITY): Payer: Self-pay | Admitting: *Deleted

## 2015-08-24 DIAGNOSIS — N898 Other specified noninflammatory disorders of vagina: Secondary | ICD-10-CM

## 2015-08-24 DIAGNOSIS — J45909 Unspecified asthma, uncomplicated: Secondary | ICD-10-CM | POA: Diagnosis not present

## 2015-08-24 DIAGNOSIS — Z3202 Encounter for pregnancy test, result negative: Secondary | ICD-10-CM | POA: Diagnosis not present

## 2015-08-24 DIAGNOSIS — Z791 Long term (current) use of non-steroidal anti-inflammatories (NSAID): Secondary | ICD-10-CM | POA: Insufficient documentation

## 2015-08-24 DIAGNOSIS — Z87440 Personal history of urinary (tract) infections: Secondary | ICD-10-CM | POA: Diagnosis not present

## 2015-08-24 DIAGNOSIS — Z79899 Other long term (current) drug therapy: Secondary | ICD-10-CM | POA: Insufficient documentation

## 2015-08-24 DIAGNOSIS — N760 Acute vaginitis: Secondary | ICD-10-CM | POA: Insufficient documentation

## 2015-08-24 DIAGNOSIS — Z8619 Personal history of other infectious and parasitic diseases: Secondary | ICD-10-CM | POA: Diagnosis not present

## 2015-08-24 DIAGNOSIS — B9689 Other specified bacterial agents as the cause of diseases classified elsewhere: Secondary | ICD-10-CM

## 2015-08-24 DIAGNOSIS — L293 Anogenital pruritus, unspecified: Secondary | ICD-10-CM | POA: Diagnosis present

## 2015-08-24 LAB — WET PREP, GENITAL
Sperm: NONE SEEN
Trich, Wet Prep: NONE SEEN
Yeast Wet Prep HPF POC: NONE SEEN

## 2015-08-24 LAB — URINALYSIS, ROUTINE W REFLEX MICROSCOPIC
BILIRUBIN URINE: NEGATIVE
GLUCOSE, UA: NEGATIVE mg/dL
Hgb urine dipstick: NEGATIVE
KETONES UR: NEGATIVE mg/dL
Nitrite: NEGATIVE
Protein, ur: NEGATIVE mg/dL
Specific Gravity, Urine: 1.022 (ref 1.005–1.030)
pH: 6 (ref 5.0–8.0)

## 2015-08-24 LAB — URINE MICROSCOPIC-ADD ON

## 2015-08-24 LAB — POC URINE PREG, ED: Preg Test, Ur: NEGATIVE

## 2015-08-24 LAB — RPR: RPR: NONREACTIVE

## 2015-08-24 MED ORDER — METRONIDAZOLE 500 MG PO TABS: 500.0000 mg | ORAL_TABLET | Freq: Two times a day (BID) | ORAL | Status: AC

## 2015-08-24 NOTE — ED Provider Notes (Signed)
Patient signed out to me by Dierdre ForthHannah Muthersbaugh, PA-C at shift change.  24 year old female presents with vaginal irritation, wet prep remarkable for BV. Treated with flagyl. UA pending, though patient denies UTI symptoms.  UA with negative nitrites, small leukocytes; 0-5 squamous epithelial cells, 6-30 WBCs, few bacteria on microscopic. Given patient has no UTI symptoms, do not feel treatment is indicated at this time, will order urine culture. Patient stable for discharge.  BP 119/63 mmHg  Pulse 57  Temp(Src) 97.9 F (36.6 C) (Oral)  Resp 20  Ht 5\' 10"  (1.778 m)  Wt 210 lb 4 oz (95.369 kg)  BMI 30.17 kg/m2  SpO2 97%  LMP 08/12/2015    Mady GemmaElizabeth C Westfall, PA-C 08/24/15 1553  Layla MawKristen N Ward, DO 08/25/15 [phone]

## 2015-08-24 NOTE — ED Notes (Signed)
Pt states that she has had vaginal itching since Wed; pt states that she has whitish discharge but no odor; pt states that there is more discharge than usual

## 2015-08-24 NOTE — Discharge Instructions (Signed)
1. Medications: Flagyl, usual home medications 2. Treatment: rest, drink plenty of fluids,  3. Follow Up: Please followup with your primary doctor in 2-3 days for discussion of your diagnoses and further evaluation after today's visit; if you do not have a primary care doctor use the resource guide provided to find one; Please return to the ER for worsening symptoms, fever, abd pain    Bacterial Vaginosis Bacterial vaginosis is a vaginal infection that occurs when the normal balance of bacteria in the vagina is disrupted. It results from an overgrowth of certain bacteria. This is the most common vaginal infection in women of childbearing age. Treatment is important to prevent complications, especially in pregnant women, as it can cause a premature delivery. CAUSES  Bacterial vaginosis is caused by an increase in harmful bacteria that are normally present in smaller amounts in the vagina. Several different kinds of bacteria can cause bacterial vaginosis. However, the reason that the condition develops is not fully understood. RISK FACTORS Certain activities or behaviors can put you at an increased risk of developing bacterial vaginosis, including:  Having a new sex partner or multiple sex partners.  Douching.  Using an intrauterine device (IUD) for contraception. Women do not get bacterial vaginosis from toilet seats, bedding, swimming pools, or contact with objects around them. SIGNS AND SYMPTOMS  Some women with bacterial vaginosis have no signs or symptoms. Common symptoms include:  Grey vaginal discharge.  A fishlike odor with discharge, especially after sexual intercourse.  Itching or burning of the vagina and vulva.  Burning or pain with urination. DIAGNOSIS  Your health care provider will take a medical history and examine the vagina for signs of bacterial vaginosis. A sample of vaginal fluid may be taken. Your health care provider will look at this sample under a microscope to  check for bacteria and abnormal cells. A vaginal pH test may also be done.  TREATMENT  Bacterial vaginosis may be treated with antibiotic medicines. These may be given in the form of a pill or a vaginal cream. A second round of antibiotics may be prescribed if the condition comes back after treatment. Because bacterial vaginosis increases your risk for sexually transmitted diseases, getting treated can help reduce your risk for chlamydia, gonorrhea, HIV, and herpes. HOME CARE INSTRUCTIONS   Only take over-the-counter or prescription medicines as directed by your health care provider.  If antibiotic medicine was prescribed, take it as directed. Make sure you finish it even if you start to feel better.  Tell all sexual partners that you have a vaginal infection. They should see their health care provider and be treated if they have problems, such as a mild rash or itching.  During treatment, it is important that you follow these instructions:  Avoid sexual activity or use condoms correctly.  Do not douche.  Avoid alcohol as directed by your health care provider.  Avoid breastfeeding as directed by your health care provider. SEEK MEDICAL CARE IF:   Your symptoms are not improving after 3 days of treatment.  You have increased discharge or pain.  You have a fever. MAKE SURE YOU:   Understand these instructions.  Will watch your condition.  Will get help right away if you are not doing well or get worse. FOR MORE INFORMATION  Centers for Disease Control and Prevention, Division of STD Prevention: SolutionApps.co.za American Sexual Health Association (ASHA): www.ashastd.org    This information is not intended to replace advice given to you by your health care provider.  Make sure you discuss any questions you have with your health care provider.   Document Released: 09/21/2005 Document Revised: 10/12/2014 Document Reviewed: 05/03/2013 Elsevier Interactive Patient Education 2016  [employer]   Emergency Department Resource Guide 1) Find a Doctor and Pay Out of Pocket Although you won't have to find out who is covered by your insurance plan, it is a good idea to ask around and get recommendations. You will then need to call the office and see if the doctor you have chosen will accept you as a new patient and what types of options they offer for patients who are self-pay. Some doctors offer discounts or will set up payment plans for their patients who do not have insurance, but you will need to ask so you aren't surprised when you get to your appointment.  2) Contact Your Local Health Department Not all health departments have doctors that can see patients for sick visits, but many do, so it is worth a call to see if yours does. If you don't know where your local health department is, you can check in your phone book. The CDC also has a tool to help you locate your state's health department, and many state websites also have listings of all of their local health departments.  3) Find a Walk-in Clinic If your illness is not likely to be very severe or complicated, you may want to try a walk in clinic. These are popping up all over the country in pharmacies, drugstores, and shopping centers. They're usually staffed by nurse practitioners or physician assistants that have been trained to treat common illnesses and complaints. They're usually fairly quick and inexpensive. However, if you have serious medical issues or chronic medical problems, these are probably not your best option.  No Primary Care Doctor: - Call Health Connect at  [phone] - they can help you locate a primary care doctor that  accepts your insurance, provides certain services, etc. - Physician Referral Service- [phone]  Chronic Pain Problems: Organization         Address  Phone   Notes  [facility]  [phone] Patients need to be referred by their primary care doctor.    Medication Assistance: Organization         Address  Phone   Notes  [facility] [address] [place], [place] [id number] [phone] --Must be a resident of [place] -- Must have NO insurance coverage whatsoever (no Medicaid/ Medicare, etc.) -- The pt. MUST have a primary care doctor that directs their care regularly and follows them in the community   [facility]ist  [phone]   [employer]  [phone]    Agencies that provide inexpensive medical care: Organization         Address  Phone   Notes  [facility]  [phone]   [facility]    [phone]   [facility] [address] [place], [place] [address] [phone]   [facility] [address], [place] [phone]   [facility]anned Parenthood    [phone]   [facility]    [phone]   [facility] Health and [facility]  [address], [place] Phone:  [phone], Fax:  [phone] Hours of Operation:  9 am - 6 pm, M-F.  Also accepts Medicaid/Medicare and self-pay.  [facility]  [address], [place] Phone: [phone], Fax: [phone]. Hours of Operation:  8:30 am - 5:30 pm, M-F.  Also accepts Medicaid and self-pay.  [facility] [place] [address] [place] Phone: [phone]   [facility] [address] Natasha Bence [place], [place] [phone], Ext. [phone] Mondays & Thursdays: 7-9 AM.  First 15 patients are seen on a first come, first serve basis.    Medicaid-accepting [facility] Providers:  Organization         Address  Phone   Notes  [facility] [address], [place] [phone] Also accepts self-pay patients.  [facility] [address] Laurell Josephs [place], [place]  [phone]   [facility] [address], [place] [phone]   [facility] [address], [place] [phone]   Renaye Rakers [address], [place]   [phone] Only accepts [place] Access [place] patients after they have their name applied to their card.   Self-Pay (no insurance) in [facility]:  Organization         Address  Phone   Notes  Sickle Cell Patients, [facility] [address] [place], [place] [phone]   [facility] [address] [place], [place] [phone]   [facility]  [address], [place] [phone]   [facility]/Dr. Osei-Bonsu  [address] [place] or [address], [place] [phone] Phone number for both [place] and [place] locations is the same.  [facility]gent Medical and [facility] [address], [place] [phone]   [facility] [address] [place] or [address] [phone]   [facility] [address], [place] [phone], phone; [phone], fax Sees patients 1st and 3rd Saturday of every month.  Must not qualify for public or private insurance (i.e. Medicaid, Medicare, [place] Health Choice, Veterans' Benefits)  Household income should be no more than 200% of the poverty level The clinic cannot treat you if you are pregnant or think you are pregnant  Sexually transmitted diseases are not treated at the clinic.    Dental Care: Organization         Address  Phone  Notes  [facility] Department of [facility] [address] [place], [place] [phone] Accepts children up to age 29 who are enrolled in [place] or [place] Health Choice; pregnant women with a Medicaid card; and children who have applied for Medicaid or [place] Health Choice, but were declined, whose parents can pay a reduced fee at time of service.    [facility] Department of [facility]  [address], [place] [phone] Accepts children up to age 52 who are enrolled in [place] or [place] Health Choice; pregnant women with a Medicaid card; and children who have applied for Medicaid or [place] Health Choice, but were declined, whose parents can pay a reduced fee at time of service.  [facility]  [address] [place], [place] [phone] Patients are seen by appointment only. Walk-ins are not accepted. [facility] will see patients 17 years of age and older. Monday - Tuesday (8am-5pm) Most Wednesdays (8:30-5pm) $30 per visit, cash only  [employer]  Adult Dental Access PROGRAM  [address], [facility] [phone] Patients are seen by appointment only. Walk-ins are not accepted. [facility] will see patients 29 years of age and older. One Wednesday Evening (Monthly: Volunteer Based).  $30 per visit, cash only  [employer]  [phone] for adults; Children under age 15, call Graduate Pediatric Dentistry at [phone]. Children aged 55-14, please call [phone] to request a pediatric application.  Dental services are provided in all areas of dental care including fillings, crowns and bridges, complete and partial dentures, implants, gum treatment, root canals, and extractions. Preventive care is also provided. Treatment is provided to both adults and children. Patients are selected via a lottery and there is often a waiting list.   [facility] [address], [place]  [phone] [email or web address]   [facility] [address] [place], [place] [phone], Ext. [phone] Second and Fourth Thursday of each month, opens at 6:30 AM; Clinic ends at 9 AM.  Patients are seen on a first-come first-served basis, and a limited number are seen during each clinic.   [facility]  [address] [place], [place] [phone]   Eligibility Requirements You must have lived in [place], [place], or [place] counties for at least the last three months.   You cannot be eligible for state or federal sponsored [place], including [employer], [place], or [employer].   You generally cannot be eligible for healthcare insurance through your employer.    How to apply: Eligibility screenings are held every Tuesday and Wednesday afternoon from 1:00 pm until 4:00 pm. You do not need an appointment for the interview!  [facility] [address] [place], [place] [phone]   [facility]  [phone]   [facility] Health Department  [phone]   [facility] Health Department  [phone]    Behavioral Health Resources in the Community: Intensive Outpatient Programs Organization         Address  Phone  Notes  [facility] Services [address] [place], [place] [phone]   [facility] [address] [place], [place] [phone]   [facility]: [facility] [address], [place], [place]  [phone]   [facility] [address]  [place], [place] [phone] or [phone]   Substance Abuse Resources Organization         Address  Phone  Notes  Alcohol and Drug Services  [phone]   [facility]  [phone]   [facility]  [phone]   Floydene Flock  [phone]   Residential & Outpatient Substance Abuse Program  [phone]   Psychological Services Organization         Address  Phone  Notes  [facility] Behavioral Health  [phone]   [facility] Services  [phone]   [facility] Mental Health [address] [place] [phone] or [phone]    Mobile Crisis Teams Organization         Address  Phone  Notes  [facility], Mobile Crisis Care Unit  [phone]   [facility]  [address] [place], [place] [phone]   Doristine Locks [address] [place] [phone]    Self-Help/Support Groups Organization         Address  Phone             Notes  [facility] of [place] - variety of support groups  336- I7437963 Call for more information  Narcotics Anonymous (NA), [facility] [address], [employer] [place]  2 meetings at this location   Statistician         Address  Phone  Notes  [facility] [address],    [place]  [phone]   [facility]  [address] [place] [address], [place], [place] [phone]   [facility] [address] [place], [place] [phone] Admissions: 8am-3pm M-F  [facility] [address],    [place], [place] [phone]   [facility] [address] [place], [place], [place] [phone]   [facility] [address],  [place], [place] [phone]   [facility] - Intensive Outpatient [address]ure[address], [place], [place] [phone]   [facility] ([facility] [address] [place]  [place], [place] [phone] or [phone]   [facility] ([facility]) [address], [place], [place] [phone] Accepts Medicaid  [facility] [address]  [place] [phone] Substance Abuse/Addiction Treatment   [facility] Organization         Address  Phone  Notes  [facility]  [phone]   Angie Fava, PhD [address] Ervin Knack [place], [place]   [phone] or [phone]   [facility] Behavioral   [address] [place], [place] [phone]   [facility] [address] [place], [place] [phone] Insurance/Medicaid/sponsorship through [facility] and Families [address]                                    [place], [place] [phone]  Therapy/tele-psych/case  [facility] [address], [place] [phone]    Dr. Lolly Mustache  [phone]   [facility] [place]  [employer] [facility] Dept. 1) [address], [place] 2) [address], [place] 3)  [address], [place] [phone]  [phone]   [facility] Child Abuse Hotline 219-126-0444 or [phone] (After Hours)

## 2015-08-24 NOTE — ED Provider Notes (Signed)
CSN: [id number]     Arrival date & time 08/24/15  0340 History   First MD Initiated Contact with Patient 08/24/15 0359     Chief Complaint  Patient presents with  . Vaginal Itching     (Consider location/radiation/quality/duration/timing/severity/associated sxs/prior Treatment) Patient is a 24 y.o. female presenting with vaginal itching. The history is provided by the patient and medical records. No language interpreter was used.  Vaginal Itching Pertinent negatives include no abdominal pain, chest pain, coughing, diaphoresis, fatigue, fever, headaches, nausea, rash or vomiting.     Kristine Williams is a 24 y.o. female  with a hx of asthma, irregular menses, UTI presents to the Emergency Department complaining of gradual, persistent, progressively worsening vaginal discharge onset 3 days ago.  She reports it began immediately after cessation of her menses. She reports this is her first menses since the birth of her son 8 months ago.  Patient reports a history of BV and yeast infections. She reports labial itching but no dysuria. She denies vaginal odor. Patient reports one female sexual partner with one sexual encounter in the last several months. She reports history of STD. She denies fever, chills, headache, neck pain, chest pain, shortness of breath, abdominal pain, nausea, vomiting, diarrhea, weakness, dizziness, syncope, dysuria, hematuria. No treatments prior to arrival. No aggravating or alleviating factors.   Past Medical History  Diagnosis Date  . Asthma   . Irregular menses   . UTI (lower urinary tract infection)    History reviewed. No pertinent past surgical history. Family History  Problem Relation Age of Onset  . Diabetes Other   . Hypertension Other    Social History  Substance Use Topics  . Smoking status: Never Smoker   . Smokeless tobacco: None  . Alcohol Use: No   OB History    Gravida Para Term Preterm AB TAB SAB Ectopic Multiple Living   0 1      Review of Systems  Constitutional: Negative for fever, diaphoresis, appetite change, fatigue and unexpected weight change.  HENT: Negative for mouth sores.   Eyes: Negative for visual disturbance.  Respiratory: Negative for cough, chest tightness, shortness of breath and wheezing.   Cardiovascular: Negative for chest pain.  Gastrointestinal: Negative for nausea, vomiting, abdominal pain, diarrhea and constipation.  Endocrine: Negative for polydipsia, polyphagia and polyuria.  Genitourinary: Positive for vaginal discharge. Negative for dysuria, urgency, frequency and hematuria.  Musculoskeletal: Negative for back pain and neck stiffness.  Skin: Negative for rash.  Allergic/Immunologic: Negative for immunocompromised state.  Neurological: Negative for syncope, light-headedness and headaches.  Hematological: Does not bruise/bleed easily.  Psychiatric/Behavioral: Negative for sleep disturbance. The patient is not nervous/anxious.       Allergies  Other  Home Medications   Prior to Admission medications   Medication Sig Start Date End Date Taking? Authorizing Provider  ibuprofen (ADVIL,MOTRIN) 600 MG tablet Take 1 tablet (600 mg total) by mouth every 6 (six) hours. 12/03/14   Huel Cote, MD  metroNIDAZOLE (FLAGYL) 500 MG tablet Take 1 tablet (500 mg total) by mouth 2 (two) times daily. One po bid x 7 days 08/24/15   Dahlia Client Susan Arana, PA-C  naproxen (NAPROSYN) 500 MG tablet Take 1 tablet (500 mg total) by mouth 2 (two) times daily. 05/13/15   Santiago Glad, PA-C  Prenatal Vit-Fe Fumarate-FA (PRENATAL MULTIVITAMIN) TABS tablet Take 1 tablet by mouth daily.    Historical Provider, MD   BP 132/82 mmHg  Pulse 61  Temp(Src) 97.9 F (36.6 C) (Oral)  Resp 18  Ht  (1.778 m)  Wt 210 lb 4 oz (95.369 kg)  BMI 30.17 kg/m2  SpO2 100%  LMP 08/12/2015 Physical Exam  Constitutional: She appears well-developed and well-nourished. No distress.  HENT:  Head: Normocephalic and  atraumatic.  Eyes: Conjunctivae are normal.  Neck: Normal range of motion.  Cardiovascular: Normal rate, regular rhythm, normal heart sounds and intact distal pulses.   No murmur heard. Pulmonary/Chest: Effort normal and breath sounds normal. No respiratory distress. She has no wheezes.  Abdominal: Soft. Bowel sounds are normal. There is no tenderness. There is no rebound and no guarding. Hernia confirmed negative in the right inguinal area and confirmed negative in the left inguinal area.  Genitourinary: Uterus normal. No labial fusion. There is no rash, tenderness or lesion on the right labia. There is no rash, tenderness or lesion on the left labia. Uterus is not deviated, not enlarged, not fixed and not tender. Cervix exhibits no motion tenderness, no discharge and no friability. Right adnexum displays no mass, no tenderness and no fullness. Left adnexum displays no mass, no tenderness and no fullness. No erythema, tenderness or bleeding in the vagina. No foreign body around the vagina. No signs of injury around the vagina. Vaginal discharge (Thin, yellow/green, moderate amount) found.  Musculoskeletal: Normal range of motion. She exhibits no edema.  Lymphadenopathy:       Right: No inguinal adenopathy present.       Left: No inguinal adenopathy present.  Neurological: She is alert.  Skin: Skin is warm and dry. She is not diaphoretic. No erythema.  Psychiatric: She has a normal mood and affect.  Nursing note and vitals reviewed.   ED Course  Procedures (including critical care time) Labs Review Labs Reviewed  WET PREP, GENITAL - Abnormal; Notable for the following:    Clue Cells Wet Prep HPF POC PRESENT (*)    WBC, Wet Prep HPF POC MANY (*)    All other components within normal limits  RPR  HIV ANTIBODY (ROUTINE TESTING)  URINALYSIS, ROUTINE W REFLEX MICROSCOPIC (NOT AT [facility])  POC URINE PREG, ED  GC/CHLAMYDIA PROBE AMP ([place]) NOT AT [facility]    Imaging Review No results  found. I have personally reviewed and evaluated these images and lab results as part of my medical decision-making.   EKG Interpretation None      MDM   Final diagnoses:  Vaginal discharge  BV (bacterial vaginosis)   Kristine Williams presents with vaginal discharge x 2 days.  Evidence of BV on wet prep. Negative pregnancy test.  Patient to be discharged with instructions to follow up with PCP. Discussed importance of using protection when sexually active. Pt understands that they have GC/Chlamydia cultures pending and that they will need to inform all sexual partners if results return positive.  Pt not concerning for PID because hemodynamically stable and no cervical motion tenderness on pelvic exam. Pt has been treated with flagyl for Bacterial Vaginosis. Pt has been advised to not drink alcohol while on this medication.   Urinalysis pending. Care transferred to [facility], PA-C who will follow-up on UA.  NO further treatment if no signs of UTI.    BP 132/82 mmHg  Pulse 61  Temp(Src) 97.9 F (36.6 C) (Oral)  Resp 18  Ht  (1.778 m)  Wt 210 lb 4 oz (95.369 kg)  BMI 30.17 kg/m2  SpO2 100%  LMP 08/12/2015     Dierdre Forth,  PA-C 08/24/15 0653  Layla MawKristen N Ward, DO 08/24/15 [phone]

## 2015-08-25 LAB — HIV ANTIBODY (ROUTINE TESTING W REFLEX): HIV Screen 4th Generation wRfx: NONREACTIVE

## 2015-08-26 LAB — GC/CHLAMYDIA PROBE AMP (~~LOC~~) NOT AT ARMC
CHLAMYDIA, DNA PROBE: NEGATIVE
NEISSERIA GONORRHEA: NEGATIVE

## 2015-08-27 LAB — URINE CULTURE

## 2015-08-28 ENCOUNTER — Telehealth (HOSPITAL_COMMUNITY): Payer: Self-pay

## 2015-08-28 NOTE — Progress Notes (Signed)
ED Antimicrobial Stewardship Positive Culture Follow Up   Randol KernOksana Williams is an 24 y.o. female who [facility] with a chief complaint of  Chief Complaint  Patient presents with  . Vaginal Itching    Recent Results (from the past 720 hour(s))  Wet prep, genital     Status: Abnormal   Collection Time: 08/24/15  5:11 AM  Result Value Ref Range Status   Yeast Wet Prep HPF POC NONE SEEN NONE SEEN Final   Trich, Wet Prep NONE SEEN NONE SEEN Final   Clue Cells Wet Prep HPF POC PRESENT (A) NONE SEEN Final   WBC, Wet Prep HPF POC MANY (A) NONE SEEN Final   Sperm NONE SEEN  Final  Urine culture     Status: None   Collection Time: 08/24/15  5:19 AM  Result Value Ref Range Status   Specimen Description URINE, CLEAN CATCH  Final   Special Requests NONE  Final   Culture   Final    >=100,000 COLONIES/mL KLEBSIELLA PNEUMONIAE [facility]    Report Status 08/27/2015 FINAL  Final   Organism ID, Bacteria KLEBSIELLA PNEUMONIAE  Final      Susceptibility   Klebsiella pneumoniae - MIC*    AMPICILLIN >=32 RESISTANT Resistant     CEFAZOLIN <=4 SENSITIVE Sensitive     CEFTRIAXONE <=1 SENSITIVE Sensitive     CIPROFLOXACIN <=0.25 SENSITIVE Sensitive     GENTAMICIN <=1 SENSITIVE Sensitive     IMIPENEM <=0.25 SENSITIVE Sensitive     NITROFURANTOIN 32 SENSITIVE Sensitive     TRIMETH/SULFA <=20 SENSITIVE Sensitive     AMPICILLIN/SULBACTAM 4 SENSITIVE Sensitive     PIP/TAZO <=4 SENSITIVE Sensitive     * >=100,000 COLONIES/mL KLEBSIELLA PNEUMONIAE    Presents with vaginal itching and denies any other urinary symptoms or flank pain. Small amount of vaginal discharge on exam. Chlamydia, gonorrhea, HIV, RPR, pregnancy all negative. Wet prep positive for bacterial vaginosis. Vitals WNL and UA shows few bacteria, small leukocytes, and negative nitrites. Urine culture grew amp resistant klebsiella.Treated outpatient with metronidazole. Discussed with Trixie DredgeEmily West, PA-C  and agrees to follow up with patient regarding vaginal symptoms. If improving, no further treatment necessary. If still symptomatic, will prescribe cephalexin 500mg  BID x7 days.  ED Provider: Trixie DredgeEmily West, PA-C   Reuel Derbyobert J Caisley Baxendale, PharmD. 08/28/2015, 11:29 AM PGY1 Resident Phone# [phone]

## 2015-08-28 NOTE — Telephone Encounter (Signed)
Post ED Visit - Positive Culture Follow-up: Successful Patient Follow-Up  Culture assessed and recommendations reviewed by: []  Enzo BiNathan Batchelder, Pharm.D. []  Celedonio MiyamotoJeremy Frens, Pharm.D., BCPS []  Garvin FilaMike Maccia, Pharm.D. []  Georgina PillionElizabeth Martin, Pharm.D., BCPS []  AltonMinh Pham, [address]Ph[address]D., BCPS, AAHIVP []  Estella HuskMichelle Turner, Pharm.D., BCPS, AAHIVP []  Cassie Stewart, Pharm.D. [x]  Sherle Poeob Vincent, [address]  Positive urine culture, >/= 100,000 colonies -> Klebsiella Pneumoniae   [x]  Patient discharged without antimicrobial prescription and treatment is now indicated []  Organism is resistant to prescribed ED discharge antimicrobial []  Patient with positive blood cultures  Changes discussed with ED provider: Trixie DredgeEmily West PA  New antibiotic prescription Keflex 500 mg BID x 7 days [facility] [phone] and given to RPh.  Contacted patient, date 08/28/2015, time 15:19   [name], Kristine Odem Dorn 08/28/2015, 4:36 PM
